# Patient Record
Sex: Female | Born: 1975 | Race: Black or African American | Hispanic: No | Marital: Single | Smoking: Former smoker
Health system: Southern US, Community
[De-identification: ages and names within clinical notes are randomized; demographics above are authoritative.]

## PROBLEM LIST (undated history)

## (undated) DIAGNOSIS — K219 Gastro-esophageal reflux disease without esophagitis: Secondary | ICD-10-CM

## (undated) DIAGNOSIS — IMO0002 Reserved for concepts with insufficient information to code with codable children: Secondary | ICD-10-CM

## (undated) DIAGNOSIS — F419 Anxiety disorder, unspecified: Secondary | ICD-10-CM

## (undated) DIAGNOSIS — R51 Headache: Secondary | ICD-10-CM

## (undated) HISTORY — DX: Anxiety disorder, unspecified: F41.9

---
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
## 1998-08-21 ENCOUNTER — Other Ambulatory Visit: Admission: RE | Admit: 1998-08-21 | Discharge: 1998-08-21 | Payer: Self-pay | Admitting: Obstetrics and Gynecology

## 1999-05-05 ENCOUNTER — Ambulatory Visit (HOSPITAL_COMMUNITY): Admission: RE | Admit: 1999-05-05 | Discharge: 1999-05-05 | Payer: Self-pay | Admitting: *Deleted

## 1999-08-19 ENCOUNTER — Other Ambulatory Visit: Admission: RE | Admit: 1999-08-19 | Discharge: 1999-08-19 | Payer: Self-pay | Admitting: Obstetrics and Gynecology

## 2000-11-18 ENCOUNTER — Other Ambulatory Visit: Admission: RE | Admit: 2000-11-18 | Discharge: 2000-11-18 | Payer: Self-pay | Admitting: *Deleted

## 2003-10-16 ENCOUNTER — Other Ambulatory Visit: Admission: RE | Admit: 2003-10-16 | Discharge: 2003-10-16 | Payer: Self-pay | Admitting: Obstetrics and Gynecology

## 2005-06-26 ENCOUNTER — Encounter: Admission: RE | Admit: 2005-06-26 | Discharge: 2005-06-26 | Payer: Self-pay | Admitting: Obstetrics and Gynecology

## 2012-12-08 LAB — OB RESULTS CONSOLE RUBELLA ANTIBODY, IGM: Rubella: IMMUNE

## 2012-12-08 LAB — OB RESULTS CONSOLE ABO/RH

## 2012-12-08 LAB — OB RESULTS CONSOLE ANTIBODY SCREEN: Antibody Screen: NEGATIVE

## 2013-06-25 ENCOUNTER — Inpatient Hospital Stay (HOSPITAL_COMMUNITY)
Admission: AD | Admit: 2013-06-25 | Discharge: 2013-06-27 | DRG: 371 | Disposition: A | Discharge status: Home / Self Care | Payer: BC Managed Care – PPO | Source: Ambulatory Visit | Attending: Obstetrics and Gynecology | Admitting: Obstetrics and Gynecology

## 2013-06-25 ENCOUNTER — Encounter (HOSPITAL_COMMUNITY)
Admission: AD | Disposition: A | Discharge status: Home / Self Care | Payer: Self-pay | Source: Ambulatory Visit | Attending: Obstetrics and Gynecology

## 2013-06-25 ENCOUNTER — Encounter (HOSPITAL_COMMUNITY): Payer: Self-pay | Admitting: *Deleted

## 2013-06-25 ENCOUNTER — Inpatient Hospital Stay (HOSPITAL_COMMUNITY): Payer: BC Managed Care – PPO | Admitting: Anesthesiology

## 2013-06-25 ENCOUNTER — Encounter (HOSPITAL_COMMUNITY): Payer: Self-pay | Admitting: Anesthesiology

## 2013-06-25 DIAGNOSIS — O09529 Supervision of elderly multigravida, unspecified trimester: Secondary | ICD-10-CM | POA: Diagnosis present

## 2013-06-25 DIAGNOSIS — O324XX0 Maternal care for high head at term, not applicable or unspecified: Secondary | ICD-10-CM | POA: Diagnosis present

## 2013-06-25 HISTORY — DX: Reserved for concepts with insufficient information to code with codable children: IMO0002

## 2013-06-25 LAB — RPR: RPR Ser Ql: NONREACTIVE

## 2013-06-25 LAB — CBC
Platelets: 299 10*3/uL (ref 150–400)
WBC: 13 10*3/uL — ABNORMAL HIGH (ref 4.0–10.5)

## 2013-06-25 LAB — TYPE AND SCREEN

## 2013-06-25 SURGERY — Surgical Case
Anesthesia: Regional | Site: Abdomen | Wound class: Clean Contaminated

## 2013-06-25 MED ORDER — IBUPROFEN 600 MG PO TABS: 600.0000 mg | ORAL_TABLET | Freq: Four times a day (QID) | ORAL | Status: DC | PRN

## 2013-06-25 MED ORDER — FENTANYL CITRATE 0.05 MG/ML IJ SOLN: 25.0000 ug | INTRAMUSCULAR | Status: DC | PRN

## 2013-06-25 MED ORDER — ONDANSETRON HCL 4 MG/2ML IJ SOLN
INTRAMUSCULAR | Status: DC | PRN
  Administered 2013-06-25: 4 mg via INTRAVENOUS

## 2013-06-25 MED ORDER — NALOXONE HCL 1 MG/ML IJ SOLN: 1.0000 ug/kg/h | INTRAVENOUS | Status: DC | PRN

## 2013-06-25 MED ORDER — KETOROLAC TROMETHAMINE 60 MG/2ML IM SOLN: 60.0000 mg | Freq: Once | INTRAMUSCULAR | Status: AC | PRN

## 2013-06-25 MED ORDER — DIPHENHYDRAMINE HCL 50 MG/ML IJ SOLN: 12.5000 mg | INTRAMUSCULAR | Status: DC | PRN

## 2013-06-25 MED ORDER — OXYTOCIN 40 UNITS IN LACTATED RINGERS INFUSION - SIMPLE MED
62.5000 mL/h | INTRAVENOUS | Status: DC
  Filled 2013-06-25: qty 1000

## 2013-06-25 MED ORDER — MEPERIDINE HCL 25 MG/ML IJ SOLN
6.2500 mg | INTRAMUSCULAR | Status: DC | PRN
Start: 2013-06-25 — End: 2013-06-25

## 2013-06-25 MED ORDER — OXYTOCIN 10 UNIT/ML IJ SOLN
INTRAMUSCULAR | Status: AC
  Filled 2013-06-25: qty 4

## 2013-06-25 MED ORDER — SODIUM BICARBONATE 8.4 % IV SOLN
INTRAVENOUS | Status: AC
  Filled 2013-06-25: qty 50

## 2013-06-25 MED ORDER — NALBUPHINE HCL 10 MG/ML IJ SOLN
5.0000 mg | INTRAMUSCULAR | Status: DC | PRN
  Filled 2013-06-25: qty 1

## 2013-06-25 MED ORDER — KETOROLAC TROMETHAMINE 30 MG/ML IJ SOLN: 30.0000 mg | Freq: Four times a day (QID) | INTRAMUSCULAR | Status: DC | PRN

## 2013-06-25 MED ORDER — METOCLOPRAMIDE HCL 5 MG/ML IJ SOLN: 10.0000 mg | Freq: Three times a day (TID) | INTRAMUSCULAR | Status: DC | PRN

## 2013-06-25 MED ORDER — CITRIC ACID-SODIUM CITRATE 334-500 MG/5ML PO SOLN
30.0000 mL | ORAL | Status: DC | PRN
  Administered 2013-06-25: 30 mL via ORAL
  Filled 2013-06-25: qty 15

## 2013-06-25 MED ORDER — OXYTOCIN BOLUS FROM INFUSION: 500.0000 mL | INTRAVENOUS | Status: DC

## 2013-06-25 MED ORDER — MORPHINE SULFATE (PF) 0.5 MG/ML IJ SOLN
INTRAMUSCULAR | Status: DC | PRN
  Administered 2013-06-25: 4 mg via EPIDURAL

## 2013-06-25 MED ORDER — MORPHINE SULFATE 0.5 MG/ML IJ SOLN
INTRAMUSCULAR | Status: AC
  Filled 2013-06-25: qty 10

## 2013-06-25 MED ORDER — EPHEDRINE 5 MG/ML INJ
10.0000 mg | INTRAVENOUS | Status: DC | PRN
  Filled 2013-06-25: qty 4

## 2013-06-25 MED ORDER — DIPHENHYDRAMINE HCL 50 MG/ML IJ SOLN: 25.0000 mg | INTRAMUSCULAR | Status: DC | PRN

## 2013-06-25 MED ORDER — FENTANYL 2.5 MCG/ML BUPIVACAINE 1/10 % EPIDURAL INFUSION (WH - ANES)
14.0000 mL/h | INTRAMUSCULAR | Status: DC | PRN
  Administered 2013-06-25 (×2): 14 mL/h via EPIDURAL
  Filled 2013-06-25 (×2): qty 125

## 2013-06-25 MED ORDER — OXYCODONE-ACETAMINOPHEN 5-325 MG PO TABS: 1.0000 | ORAL_TABLET | ORAL | Status: DC | PRN

## 2013-06-25 MED ORDER — ONDANSETRON HCL 4 MG/2ML IJ SOLN: 4.0000 mg | Freq: Three times a day (TID) | INTRAMUSCULAR | Status: DC | PRN

## 2013-06-25 MED ORDER — NALOXONE HCL 0.4 MG/ML IJ SOLN: 0.4000 mg | INTRAMUSCULAR | Status: DC | PRN

## 2013-06-25 MED ORDER — MEPERIDINE HCL 25 MG/ML IJ SOLN
INTRAMUSCULAR | Status: AC
  Filled 2013-06-25: qty 1

## 2013-06-25 MED ORDER — LACTATED RINGERS IV SOLN: 500.0000 mL | Freq: Once | INTRAVENOUS | Status: DC

## 2013-06-25 MED ORDER — LACTATED RINGERS IV SOLN: 500.0000 mL | INTRAVENOUS | Status: DC | PRN

## 2013-06-25 MED ORDER — OXYTOCIN 40 UNITS IN LACTATED RINGERS INFUSION - SIMPLE MED
1.0000 m[IU]/min | INTRAVENOUS | Status: DC
  Administered 2013-06-25: 2 m[IU]/min via INTRAVENOUS

## 2013-06-25 MED ORDER — ACETAMINOPHEN 325 MG PO TABS: 650.0000 mg | ORAL_TABLET | ORAL | Status: DC | PRN

## 2013-06-25 MED ORDER — NALBUPHINE HCL 10 MG/ML IJ SOLN: 5.0000 mg | INTRAMUSCULAR | Status: DC | PRN

## 2013-06-25 MED ORDER — 0.9 % SODIUM CHLORIDE (POUR BTL) OPTIME
TOPICAL | Status: DC | PRN
  Administered 2013-06-25: 1000 mL

## 2013-06-25 MED ORDER — LACTATED RINGERS IV SOLN
INTRAVENOUS | Status: DC
  Administered 2013-06-25 (×2): via INTRAVENOUS

## 2013-06-25 MED ORDER — DIPHENHYDRAMINE HCL 25 MG PO CAPS: 25.0000 mg | ORAL_CAPSULE | ORAL | Status: DC | PRN

## 2013-06-25 MED ORDER — OXYTOCIN 10 UNIT/ML IJ SOLN
40.0000 [IU] | INTRAVENOUS | Status: DC | PRN
  Administered 2013-06-25: 40 [IU] via INTRAVENOUS

## 2013-06-25 MED ORDER — ONDANSETRON HCL 4 MG/2ML IJ SOLN: 4.0000 mg | Freq: Four times a day (QID) | INTRAMUSCULAR | Status: DC | PRN

## 2013-06-25 MED ORDER — SCOPOLAMINE 1 MG/3DAYS TD PT72
1.0000 | MEDICATED_PATCH | Freq: Once | TRANSDERMAL | Status: DC
  Administered 2013-06-25: 1.5 mg via TRANSDERMAL

## 2013-06-25 MED ORDER — ONDANSETRON HCL 4 MG/2ML IJ SOLN
INTRAMUSCULAR | Status: AC
  Filled 2013-06-25: qty 2

## 2013-06-25 MED ORDER — MEPERIDINE HCL 25 MG/ML IJ SOLN
INTRAMUSCULAR | Status: DC | PRN
  Administered 2013-06-25 (×2): 12.5 mg via INTRAVENOUS

## 2013-06-25 MED ORDER — LACTATED RINGERS IV SOLN
INTRAVENOUS | Status: DC | PRN
  Administered 2013-06-25 (×2): via INTRAVENOUS

## 2013-06-25 MED ORDER — EPHEDRINE 5 MG/ML INJ: 10.0000 mg | INTRAVENOUS | Status: DC | PRN

## 2013-06-25 MED ORDER — PHENYLEPHRINE 40 MCG/ML (10ML) SYRINGE FOR IV PUSH (FOR BLOOD PRESSURE SUPPORT)
80.0000 ug | PREFILLED_SYRINGE | INTRAVENOUS | Status: DC | PRN
  Filled 2013-06-25: qty 5

## 2013-06-25 MED ORDER — CEFAZOLIN SODIUM-DEXTROSE 2-3 GM-% IV SOLR
2.0000 g | Freq: Once | INTRAVENOUS | Status: AC
  Administered 2013-06-25: 2 g via INTRAVENOUS
  Filled 2013-06-25: qty 50

## 2013-06-25 MED ORDER — LIDOCAINE HCL (PF) 1 % IJ SOLN: 30.0000 mL | INTRAMUSCULAR | Status: DC | PRN

## 2013-06-25 MED ORDER — KETOROLAC TROMETHAMINE 60 MG/2ML IM SOLN
INTRAMUSCULAR | Status: AC
  Administered 2013-06-25: 60 mg via INTRAMUSCULAR
  Filled 2013-06-25: qty 2

## 2013-06-25 MED ORDER — LIDOCAINE HCL (PF) 1 % IJ SOLN
INTRAMUSCULAR | Status: DC | PRN
  Administered 2013-06-25 (×2): 5 mL

## 2013-06-25 MED ORDER — SODIUM CHLORIDE 0.9 % IJ SOLN: 3.0000 mL | INTRAMUSCULAR | Status: DC | PRN

## 2013-06-25 MED ORDER — SODIUM BICARBONATE 8.4 % IV SOLN
INTRAVENOUS | Status: DC | PRN
  Administered 2013-06-25: 5 mL via EPIDURAL

## 2013-06-25 MED ORDER — LIDOCAINE-EPINEPHRINE (PF) 2 %-1:200000 IJ SOLN
INTRAMUSCULAR | Status: AC
  Filled 2013-06-25: qty 20

## 2013-06-25 MED ORDER — SCOPOLAMINE 1 MG/3DAYS TD PT72
MEDICATED_PATCH | TRANSDERMAL | Status: AC
  Filled 2013-06-25: qty 1

## 2013-06-25 MED ORDER — TERBUTALINE SULFATE 1 MG/ML IJ SOLN: 0.2500 mg | Freq: Once | INTRAMUSCULAR | Status: DC | PRN

## 2013-06-25 MED ORDER — FLEET ENEMA 7-19 GM/118ML RE ENEM: 1.0000 | ENEMA | RECTAL | Status: DC | PRN

## 2013-06-25 MED ORDER — PHENYLEPHRINE 40 MCG/ML (10ML) SYRINGE FOR IV PUSH (FOR BLOOD PRESSURE SUPPORT): 80.0000 ug | PREFILLED_SYRINGE | INTRAVENOUS | Status: DC | PRN

## 2013-06-25 MED ORDER — ACETAMINOPHEN 10 MG/ML IV SOLN: 1000.0000 mg | Freq: Four times a day (QID) | INTRAVENOUS | Status: DC | PRN

## 2013-06-25 SURGICAL SUPPLY — 29 items
CLAMP CORD UMBIL (MISCELLANEOUS) IMPLANT
CLOTH BEACON ORANGE TIMEOUT ST (SAFETY) ×2 IMPLANT
DRAPE LG THREE QUARTER DISP (DRAPES) ×2 IMPLANT
DRSG OPSITE POSTOP 4X10 (GAUZE/BANDAGES/DRESSINGS) ×2 IMPLANT
DURAPREP 26ML APPLICATOR (WOUND CARE) ×2 IMPLANT
ELECT REM PT RETURN 9FT ADLT (ELECTROSURGICAL) ×2
ELECTRODE REM PT RTRN 9FT ADLT (ELECTROSURGICAL) ×1 IMPLANT
EXTRACTOR VACUUM M CUP 4 TUBE (SUCTIONS) IMPLANT
GLOVE BIO SURGEON STRL SZ 6.5 (GLOVE) ×2 IMPLANT
GLOVE BIOGEL PI IND STRL 6.5 (GLOVE) ×1 IMPLANT
GLOVE BIOGEL PI INDICATOR 6.5 (GLOVE) ×1
GOWN STRL REIN XL XLG (GOWN DISPOSABLE) ×4 IMPLANT
KIT ABG SYR 3ML LUER SLIP (SYRINGE) IMPLANT
NEEDLE HYPO 25X5/8 SAFETYGLIDE (NEEDLE) IMPLANT
NS IRRIG 1000ML POUR BTL (IV SOLUTION) ×2 IMPLANT
PACK C SECTION WH (CUSTOM PROCEDURE TRAY) ×2 IMPLANT
PAD OB MATERNITY 4.3X12.25 (PERSONAL CARE ITEMS) ×2 IMPLANT
RTRCTR C-SECT PINK 25CM LRG (MISCELLANEOUS) ×2 IMPLANT
STAPLER VISISTAT 35W (STAPLE) IMPLANT
SUT MON AB 2-0 CT1 27 (SUTURE) ×2 IMPLANT
SUT MON AB 4-0 PS1 27 (SUTURE) IMPLANT
SUT PDS AB 0 CTX 60 (SUTURE) IMPLANT
SUT PLAIN 2 0 XLH (SUTURE) IMPLANT
SUT VIC AB 0 CTX 36 (SUTURE) ×4
SUT VIC AB 0 CTX36XBRD ANBCTRL (SUTURE) ×4 IMPLANT
SUT VIC AB 4-0 KS 27 (SUTURE) IMPLANT
TOWEL OR 17X24 6PK STRL BLUE (TOWEL DISPOSABLE) ×6 IMPLANT
TRAY FOLEY CATH 14FR (SET/KITS/TRAYS/PACK) IMPLANT
WATER STERILE IRR 1000ML POUR (IV SOLUTION) ×2 IMPLANT

## 2013-06-25 NOTE — Progress Notes (Signed)
Dr. Jean Rosenthal at bedside- MD ordered to give Phenlyphrine and Ephedrine alternating for BP.

## 2013-06-25 NOTE — Anesthesia Preprocedure Evaluation (Signed)
Anesthesia Evaluation  Patient identified by MRN, date of birth, ID band Patient awake    Reviewed: Allergy & Precautions, H&P , Patient's Chart, lab work & pertinent test results  Airway Mallampati: II TM Distance: >3 FB Neck ROM: full    Dental no notable dental hx.    Pulmonary neg pulmonary ROS,  breath sounds clear to auscultation  Pulmonary exam normal       Cardiovascular Exercise Tolerance: Good negative cardio ROS  Rhythm:regular Rate:Normal     Neuro/Psych negative neurological ROS  negative psych ROS   GI/Hepatic negative GI ROS, Neg liver ROS,   Endo/Other  negative endocrine ROS  Renal/GU negative Renal ROS  negative genitourinary   Musculoskeletal   Abdominal Normal abdominal exam  (+)   Peds  Hematology negative hematology ROS (+)   Anesthesia Other Findings   Reproductive/Obstetrics negative OB ROS (+) Pregnancy                           Anesthesia Physical Anesthesia Plan  ASA: II  Anesthesia Plan: Epidural   Post-op Pain Management:    Induction:   Airway Management Planned:   Additional Equipment:   Intra-op Plan:   Post-operative Plan:   Informed Consent: I have reviewed the patients History and Physical, chart, labs and discussed the procedure including the risks, benefits and alternatives for the proposed anesthesia with the patient or authorized representative who has indicated his/her understanding and acceptance.     Plan Discussed with: Anesthesiologist, CRNA and Surgeon  Anesthesia Plan Comments:         Anesthesia Quick Evaluation

## 2013-06-25 NOTE — H&P (Addendum)
37 y.o. [redacted]w[redacted]d  [id number] comes in c/o strong contractions.  Otherwise has good fetal movement and no bleeding.  Past Medical History  Diagnosis Date  . Medical history non-contributory     Past Surgical History  Procedure Laterality Date  . No past surgeries      OB History   Grav Para Term Preterm Abortions TAB SAB Ect Mult Living   5 1 1  3 3    1      # Outc Date GA Lbr Len/2nd Wgt Sex Del Anes PTL Lv   1 TAB 1993           2 TRM 1/96 [redacted]w[redacted]d 04:00 3.175kg(7lb) M SVD  No Yes   3 TAB 6/96           4 TAB 2000           5 CUR               History   Social History  . Marital Status: Married    Spouse Name: N/A    Number of Children: N/A  . Years of Education: N/A   Occupational History  . Not on file.   Social History Main Topics  . Smoking status: Former Games developer  . Smokeless tobacco: Never Used  . Alcohol Use: No  . Drug Use: No  . Sexually Active: Yes    Birth Control/ Protection: None   Other Topics Concern  . Not on file   Social History Narrative  . No narrative on file   Sulfa antibiotics    Prenatal Transfer Tool  Maternal Diabetes: No Genetic Screening: Normal Maternal Ultrasounds/Referrals: Normal Fetal Ultrasounds or other Referrals:  None Maternal Substance Abuse:  No Significant Maternal Medications:  None Significant Maternal Lab Results: Lab values include: Group B Strep negative  Other PNC: uncomplicated    Filed Vitals:   06/25/13 0931  BP: 120/70  Pulse: 89  Temp:   Resp:      Lungs/Cor:  NAD Abdomen:  soft, gravid Ex:  no cords, erythema SVE:  2/90/-2 at admission, now 7/100/-2 FHTs:  145, good STV, NST R, recent late decel x 1 Toco:  q 2-4   A/P   Admit to L&D  GBS Neg  S/p epidural, comfortable  Cont routine mgmt.  Philip Aspen

## 2013-06-25 NOTE — Progress Notes (Signed)
Pt has been pushing 60 mins with no descent of head which is at 0 station.  She has a hx of forceps/vaccuum and episiotomy assisted delivery 74yrs ago for 7lb baby.  I believe this baby is 7/12 to 8lbs and suspect OP presentation.  Throughout labor course pt has had late decelerations which are currently resolved, however baseline has increased to about 165-170.  I discussed cesarean deliver with patient and she agrees, states she is exhausted and doesn't feel like she can push.  Will proceed with cesarean section.  Consent obtained.

## 2013-06-25 NOTE — Anesthesia Procedure Notes (Signed)
Epidural Patient location during procedure: OB Start time: 06/25/2013 4:20 AM  Staffing Anesthesiologist: Angus Seller., Harrell Gave. Performed by: anesthesiologist   Preanesthetic Checklist Completed: patient identified, site marked, surgical consent, pre-op evaluation, timeout performed, IV checked, risks and benefits discussed and monitors and equipment checked  Epidural Patient position: sitting Prep: site prepped and draped and DuraPrep Patient monitoring: continuous pulse ox and blood pressure Approach: midline Injection technique: LOR air and LOR saline  Needle:  Needle type: Tuohy  Needle gauge: 17 G Needle length: 9 cm and 9 Needle insertion depth: 5 cm cm Catheter type: closed end flexible Catheter size: 19 Gauge Catheter at skin depth: 10 cm Test dose: negative  Assessment Events: blood not aspirated, injection not painful, no injection resistance, negative IV test and no paresthesia  Additional Notes Patient identified.  Risk benefits discussed including failed block, incomplete pain control, headache, nerve damage, paralysis, blood pressure changes, nausea, vomiting, reactions to medication both toxic or allergic, and postpartum back pain.  Patient expressed understanding and wished to proceed.  All questions were answered.  Sterile technique used throughout procedure and epidural site dressed with sterile barrier dressing. No paresthesia or other complications noted.The patient did not experience any signs of intravascular injection such as tinnitus or metallic taste in mouth nor signs of intrathecal spread such as rapid motor block. Please see nursing notes for vital signs.

## 2013-06-25 NOTE — Progress Notes (Signed)
Notified of BP- MD orders to give og Phenylephrine

## 2013-06-25 NOTE — Anesthesia Postprocedure Evaluation (Signed)
  Anesthesia Post Note  Patient: Cynthia Garcia  Procedure(s) Performed: Procedure(s) (LRB): Primary cesarean section with delivery of baby girl at 23. Apgars1/7/8 (N/A)  Anesthesia type: Epidural  Patient location: PACU  Post pain: Pain level controlled  Post assessment: Post-op Vital signs reviewed  Last Vitals:  Filed Vitals:   06/25/13 1945  BP: 121/70  Pulse: 82  Temp: 37.2 C  Resp: 15    Post vital signs: Reviewed  Level of consciousness: awake  Complications: No apparent anesthesia complications

## 2013-06-25 NOTE — OR Nursing (Signed)
Uterus massaged by S. Veroncia Jezek RN.   25 cc of blood evacuated from uterus during uterine massage.  Two tubes  of cord blood sent to lab. 

## 2013-06-25 NOTE — Brief Op Note (Signed)
06/25/2013  5:08 PM  PATIENT:  Cynthia Garcia  37 y.o. female  PRE-OPERATIVE DIAGNOSIS:  failure to descend, maternal exhaustion  POST-OPERATIVE DIAGNOSIS:  failure to descend, asynclitic  PROCEDURE:  Procedure(s): Primary cesarean section with delivery of baby girl at 24. Apgars1/7/8 (N/A) LTCS  SURGEON:  Surgeon(s) and Role:    * Philip Aspen, DO - Primary  ANESTHESIA:   epidural  FINDINGS: normal uterus, tubes and ovaries, female infant, OT presentation, Left asynclitism, APGARs 1,7,8 (neonatology suspected due to very thick nasal/oral secretions, Weight 8#7  EBL:  Total I/O In: 1000 [I.V.:1000] Out: 1250 [Urine:400; Blood:850]  BLOOD ADMINISTERED:none  SPECIMEN:  Source of Specimen:  cord pH: 7.15, cord blood   COUNTS:  YES  TOURNIQUET:  * No tourniquets in log *  PATIENT DISPOSITION:  PACU - hemodynamically stable.

## 2013-06-25 NOTE — Transfer of Care (Signed)
Immediate Anesthesia Transfer of Care Note  Patient: Cynthia Garcia  Procedure(s) Performed: Procedure(s): Primary cesarean section with delivery of baby girl at 3. Apgars1/7/8 (N/A)  Patient Location: PACU  Anesthesia Type:Epidural  Level of Consciousness: awake, alert  and oriented  Airway & Oxygen Therapy: Patient Spontanous Breathing  Post-op Assessment: Report given to PACU RN and Post -op Vital signs reviewed and stable  Post vital signs: Reviewed and stable  Complications: No apparent anesthesia complications

## 2013-06-26 ENCOUNTER — Encounter (HOSPITAL_COMMUNITY): Payer: Self-pay | Admitting: Obstetrics and Gynecology

## 2013-06-26 LAB — CBC
MCH: 29 pg (ref 26.0–34.0)
MCHC: 33.4 g/dL (ref 30.0–36.0)

## 2013-06-26 MED ORDER — MENTHOL 3 MG MT LOZG
1.0000 | LOZENGE | OROMUCOSAL | Status: DC | PRN
  Filled 2013-06-26: qty 9

## 2013-06-26 MED ORDER — ONDANSETRON HCL 4 MG/2ML IJ SOLN: 4.0000 mg | INTRAMUSCULAR | Status: DC | PRN

## 2013-06-26 MED ORDER — DIPHENHYDRAMINE HCL 25 MG PO CAPS: 25.0000 mg | ORAL_CAPSULE | Freq: Four times a day (QID) | ORAL | Status: DC | PRN

## 2013-06-26 MED ORDER — PRENATAL MULTIVITAMIN CH
1.0000 | ORAL_TABLET | Freq: Every day | ORAL | Status: DC
Start: 2013-06-26 — End: 2013-06-27
  Administered 2013-06-26 – 2013-06-27 (×2): 1 via ORAL
  Filled 2013-06-26 (×2): qty 1

## 2013-06-26 MED ORDER — LACTATED RINGERS IV SOLN
INTRAVENOUS | Status: DC
  Administered 2013-06-26: 01:00:00 via INTRAVENOUS

## 2013-06-26 MED ORDER — OXYCODONE-ACETAMINOPHEN 5-325 MG PO TABS
1.0000 | ORAL_TABLET | ORAL | Status: DC | PRN
  Administered 2013-06-26 (×4): 1 via ORAL
  Administered 2013-06-27 (×3): 2 via ORAL
  Filled 2013-06-26 (×2): qty 2
  Filled 2013-06-26 (×6): qty 1
  Filled 2013-06-26: qty 2

## 2013-06-26 MED ORDER — SIMETHICONE 80 MG PO CHEW: 80.0000 mg | CHEWABLE_TABLET | ORAL | Status: DC | PRN

## 2013-06-26 MED ORDER — LANOLIN HYDROUS EX OINT: 1.0000 "application " | TOPICAL_OINTMENT | CUTANEOUS | Status: DC | PRN

## 2013-06-26 MED ORDER — IBUPROFEN 600 MG PO TABS
600.0000 mg | ORAL_TABLET | Freq: Four times a day (QID) | ORAL | Status: DC
Start: 2013-06-26 — End: 2013-06-27
  Filled 2013-06-26: qty 1

## 2013-06-26 MED ORDER — ONDANSETRON HCL 4 MG PO TABS: 4.0000 mg | ORAL_TABLET | ORAL | Status: DC | PRN

## 2013-06-26 MED ORDER — OXYTOCIN 40 UNITS IN LACTATED RINGERS INFUSION - SIMPLE MED: 62.5000 mL/h | INTRAVENOUS | Status: AC

## 2013-06-26 MED ORDER — ZOLPIDEM TARTRATE 5 MG PO TABS: 5.0000 mg | ORAL_TABLET | Freq: Every evening | ORAL | Status: DC | PRN

## 2013-06-26 MED ORDER — WITCH HAZEL-GLYCERIN EX PADS: 1.0000 "application " | MEDICATED_PAD | CUTANEOUS | Status: DC | PRN

## 2013-06-26 MED ORDER — TETANUS-DIPHTH-ACELL PERTUSSIS 5-2.5-18.5 LF-MCG/0.5 IM SUSP: 0.5000 mL | Freq: Once | INTRAMUSCULAR | Status: DC

## 2013-06-26 MED ORDER — DIBUCAINE 1 % RE OINT: 1.0000 "application " | TOPICAL_OINTMENT | RECTAL | Status: DC | PRN

## 2013-06-26 MED ORDER — SENNOSIDES-DOCUSATE SODIUM 8.6-50 MG PO TABS
2.0000 | ORAL_TABLET | Freq: Every day | ORAL | Status: DC
Start: 2013-06-26 — End: 2013-06-27
  Administered 2013-06-26: 2 via ORAL

## 2013-06-26 MED ORDER — SIMETHICONE 80 MG PO CHEW
80.0000 mg | CHEWABLE_TABLET | Freq: Three times a day (TID) | ORAL | Status: DC
Start: 2013-06-26 — End: 2013-06-27
  Administered 2013-06-26 – 2013-06-27 (×4): 80 mg via ORAL

## 2013-06-26 NOTE — Progress Notes (Signed)
POD#1 Pt without complaints. Lochia- mod/ VSSAF [id number] Fundus- min tender, appropriate for recent surgery. IMP/ Stable Plan/ routine care

## 2013-06-26 NOTE — Lactation Note (Signed)
This note was copied from the chart of Cynthia Arlis Porta. Lactation Consultation Note Mom reports that baby has been nursing well since delivery. Asking about foods to eat- questions answered, BF brochure given - resources for support after DC discussed with mom- BFSG and OP appointments. Asking about pump from insurance- encouraged to call insurance company and see what they cover and how to obtain it. No further questions at present. To call for assist prn  Patient Name: Cynthia Garcia Today's Date: 06/26/2013 Reason for consult: Initial assessment   Maternal Data Formula Feeding for Exclusion: No Infant to breast within first hour of birth: No Breastfeeding delayed due to:: Maternal status Does the patient have breastfeeding experience prior to this delivery?: No  Feeding Feeding Type: Breast Milk  LATCH Score/Interventions                      Lactation Tools Discussed/Used     Consult Status Consult Status: Follow-up Date: 06/27/13 Follow-up type: In-patient    Pamelia Hoit 06/26/2013, 1:54 PM

## 2013-06-26 NOTE — Op Note (Signed)
NAMEMALAYA, CAGLEY NO.:  [id number]  MEDICAL RECORD NO.:  [id number]  LOCATION:  9105                          FACILITY:  [facility]  PHYSICIAN:  Philip Aspen, DO    DATE OF BIRTH:  [birth year]  DATE OF PROCEDURE: DATE OF DISCHARGE:                              OPERATIVE REPORT   PREOPERATIVE DIAGNOSIS:  Failure to descend, maternal exhaustion.  POSTOPERATIVE DIAGNOSIS:  Failure to descend, occiput transverse to OP and asynclitic.  PROCEDURE:  Low-transverse cesarean section.  SURGEON:  Philip Aspen, DO  ANESTHESIA:  Epidural.  FINDINGS:  Normal uterus, tubes, and ovaries bilaterally.  Female infant in cephalic presentation noted above.  Apgars of 1, 7, and 8.  Weight 8 pounds 7 ounces.  ESTIMATED BLOOD LOSS:  850 mL.  URINE OUTPUT:  400 mL.  FLUIDS:  1000 mL.  Cord pH 7.15.  SPECIMENS:  Cord blood.  COMPLICATIONS:  None.  CONDITION:  Stable to PACU.  DESCRIPTION OF PROCEDURE:  The patient was taken to the operating room, where epidural anesthesia was tested and found to be adequate.  She was then prepped and draped in normal sterile fashion, dorsal supine position.  Pfannenstiel skin incision was made with a scalpel and carried through to the underlying layer of fascia with Bovie cautery. Fascia was incised with a scalpel and extended laterally with Mayo scissors.  Kocher clamps were placed at the superior aspect of the fascial incision, which was tented and rectus muscles dissected off bluntly and sharply.  Kocher clamps were then placed at the inferior aspect of fascial incision, which was also tented with rectus muscles dissected off bluntly and sharply.  Rectus muscles were separated in the midline with hemostat and peritoneum was entered bluntly.  Peritoneal incision was extended bilateral traction.  The abdomen was surveyed manually and found to be normal.  The Alexis self retractor was placed. A bladder flap was created with Metzenbaum  scissors and digital dissection.  A low transverse cesarean incision was made, and the amniotic sac entered sharply.  Meconium was noted.  Uterine incision was extended with cephalic and caudal traction.  The infant's head was elevated and delivered without difficulty followed by the body.  Cord was clamped and cut, and infant was handed off to awaiting neonatology. Sample cord pH was collected and sent to the lab.  The cord blood was collected and the placenta was delivered by gentle traction on the umbilical cord.  The uterus was cleared of all clot and debris.  The uterine incision was closed in 2 layers, 1st with a running locked Vicryl suture and second 5 horizontal Lambert imbrication.  The incision was found to be hemostatic.  An Alexis retractor was removed.  Gutters were cleared of all clot and debris.  The peritoneum was then closed with Monocryl in a running fashion from superior to inferior, followed by inferior to superior reapproximation of rectus muscles without any stricture.  Fascia was then reapproximated and closed with Vicryl in a running fashion.  Subcutaneous tissue was irrigated and dried.  Minimal use of cautery was used for hemostasis.  The skin was reapproximated and closed with staples.  The patient  tolerated the procedure well.  Sponge, lap, and needle counts were correct x2.  The patient was taken to recovery in stable condition.          ______________________________ Philip Aspen, DO     [place]/MEDQ  D:  06/25/2013  T:  06/26/2013  Job:  [id number]

## 2013-06-26 NOTE — Anesthesia Postprocedure Evaluation (Signed)
Anesthesia Post Note  Patient: Cynthia Garcia  Procedure(s) Performed: Procedure(s) (LRB): Primary cesarean section with delivery of baby girl at 10. Apgars1/7/8 (N/A)  Anesthesia type: Epidural  Patient location: Mother/Baby  Post pain: Pain level controlled  Post assessment: Post-op Vital signs reviewed  Last Vitals:  Filed Vitals:   06/26/13 0434  BP: 118/71  Pulse: 78  Temp: 36.5 C  Resp: 18    Post vital signs: Reviewed  Level of consciousness: awake  Complications: No apparent anesthesia complications

## 2013-06-27 MED ORDER — OXYCODONE-ACETAMINOPHEN 5-325 MG PO TABS: 1.0000 | ORAL_TABLET | ORAL | Status: DC | PRN

## 2013-06-27 NOTE — Progress Notes (Signed)
Per new MD order; remove staples prior to discharge, place steri strips; do not replace honeycomb dressing

## 2013-06-27 NOTE — Plan of Care (Signed)
Problem: Discharge Progression Outcomes Goal: Remove staples per MD order Outcome: Not Applicable Date Met:  06/27/13 Pt to return to MD for staple removal Thursday, July 31 at 3:00 PM

## 2013-06-27 NOTE — Progress Notes (Signed)
  Patient is eating, ambulating, voiding.  Pain control is good.  Filed Vitals:   06/26/13 0434 06/26/13 1230 06/26/13 1900 06/27/13 0606  BP: 118/71 97/59 104/66 102/66  Pulse: 78 68 72 88  Temp: 97.7 F (36.5 C) 97 F (36.1 C) 97.5 F (36.4 C) 98.2 F (36.8 C)  TempSrc: Oral Oral Oral Oral  Resp: 18 16 18 17   Height:      Weight:      SpO2: 99%       lungs:   clear to auscultation cor:    RRR Abdomen:  soft, appropriate tenderness, incisions intact and without erythema or exudate ex:    no cords   Lab Results  Component Value Date   WBC 24.5* 06/26/2013   HGB 9.7* 06/26/2013   HCT 29.0* 06/26/2013   MCV 86.8 06/26/2013   PLT 256 06/26/2013    --/--/O POS, O POS (07/27 0315)/RI  A/P    Post operative day 2.  Routine post op and postpartum care.  Expect d/c today.  Percocet for pain control.

## 2013-06-27 NOTE — Discharge Summary (Signed)
Obstetric Discharge Summary Reason for Admission: onset of labor Prenatal Procedures: none Intrapartum Procedures: cesarean: low cervical, transverse Postpartum Procedures: none Complications-Operative and Postpartum: none Hemoglobin  Date Value Range Status  06/26/2013 9.7* 12.0 - 15.0 g/dL Final     DELTA CHECK NOTED     REPEATED TO VERIFY     HCT  Date Value Range Status  06/26/2013 29.0* 36.0 - 46.0 % Final    Discharge Diagnoses: Term Pregnancy-delivered  Discharge Information: Date: 06/27/2013 Activity: pelvic rest Diet: routine Medications: Iron and Percocet Condition: stable Instructions: refer to practice specific booklet Discharge to: home   Newborn Data: Live born female  Birth Weight: 8 lb 7.4 oz (3839 g) APGAR: 1, 7  Home with mother.  Kamil Mchaffie A 06/27/2013, 12:21 PM

## 2013-06-27 NOTE — Progress Notes (Signed)
Patient to return to MD office in 2 days for staple removal per Dr. Henderson Cloud. Instructions given to patient to return to MD office within 2 days. Patient verbalized her understanding.

## 2013-07-27 ENCOUNTER — Other Ambulatory Visit: Payer: Self-pay

## 2014-01-09 LAB — OB RESULTS CONSOLE RUBELLA ANTIBODY, IGM: Rubella: NON-IMMUNE/NOT IMMUNE

## 2014-01-09 LAB — OB RESULTS CONSOLE HIV ANTIBODY (ROUTINE TESTING): HIV: NONREACTIVE

## 2014-06-13 ENCOUNTER — Encounter (HOSPITAL_COMMUNITY): Payer: Self-pay | Admitting: Pharmacy Technician

## 2014-06-18 ENCOUNTER — Encounter (HOSPITAL_COMMUNITY)
Admission: RE | Admit: 2014-06-18 | Discharge: 2014-06-18 | Disposition: A | Discharge status: Home / Self Care | Payer: BC Managed Care – PPO | Source: Ambulatory Visit | Attending: Obstetrics and Gynecology | Admitting: Obstetrics and Gynecology

## 2014-06-18 ENCOUNTER — Encounter (HOSPITAL_COMMUNITY): Payer: Self-pay

## 2014-06-18 DIAGNOSIS — Z01812 Encounter for preprocedural laboratory examination: Secondary | ICD-10-CM | POA: Insufficient documentation

## 2014-06-18 DIAGNOSIS — Z01818 Encounter for other preprocedural examination: Secondary | ICD-10-CM | POA: Insufficient documentation

## 2014-06-18 HISTORY — DX: Headache: R51

## 2014-06-18 HISTORY — DX: Gastro-esophageal reflux disease without esophagitis: K21.9

## 2014-06-18 HISTORY — DX: Reserved for concepts with insufficient information to code with codable children: IMO0002

## 2014-06-18 LAB — TYPE AND SCREEN
ABO/RH(D): O POS
ANTIBODY SCREEN: NEGATIVE

## 2014-06-18 LAB — CBC
HCT: 33.5 % — ABNORMAL LOW (ref 36.0–46.0)
Hemoglobin: 11.1 g/dL — ABNORMAL LOW (ref 12.0–15.0)
MCH: 29 pg (ref 26.0–34.0)
MCHC: 33.1 g/dL (ref 30.0–36.0)
MCV: 87.5 fL (ref 78.0–100.0)
PLATELETS: 257 10*3/uL (ref 150–400)
RBC: 3.83 MIL/uL — ABNORMAL LOW (ref 3.87–5.11)
RDW: 14.1 % (ref 11.5–15.5)
WBC: 7.9 10*3/uL (ref 4.0–10.5)

## 2014-06-18 LAB — RPR

## 2014-06-18 NOTE — Patient Instructions (Addendum)
   Your procedure is scheduled on:  Wednesday, July 22  [facility] at:  6 AM Pick up the phone at the desk and dial [phone] and inform us of your arrival.  Please call this number if you have any problems the morning of surgery: [phone]  Remember: Do not eat or drink after midnight: Tuesday Take these medicines the morning of surgery with a SIP OF WATER:  None  Do not wear jewelry, make-up, or FINGER nail polish No metal in your hair or on your body. Do not wear lotions, powders, perfumes.  You may wear deodorant.  Do not bring valuables to the hospital. Contacts, dentures or bridgework may not be worn into surgery.  Leave suitcase in the car. After Surgery it may be brought to your room. For patients being admitted to the hospital, checkout time is 11:00am the day of discharge.  Home with fiance "Cynthia Garcia"  cell [phone].

## 2014-06-19 ENCOUNTER — Other Ambulatory Visit: Payer: Self-pay | Admitting: Obstetrics and Gynecology

## 2014-06-19 NOTE — H&P (Signed)
38 y.o.  4659w0d    [id number] comes in for a repeat cesarean section at term.  Patient has good fetal movement and no bleeding. She also desires permanent sterilization.  Past Medical History  Diagnosis Date  . Abnormal Pap smear 1996  . SVD (spontaneous vaginal delivery)     x 1  . Termination of pregnancy     x 3  . GERD (gastroesophageal reflux disease)     otc prn with pregnancy  . Headache(784.0)     otc meds prn    Past Surgical History  Procedure Laterality Date  . Cervical biopsy  w/ loop electrode excision  05/1995  . Cesarean section N/A 06/25/2013    Procedure: Primary cesarean section with delivery of baby girl at 1632. Apgars1/7/8;  Surgeon: Philip AspenSidney Callahan, DO;  Location: [facility];  Service: Obstetrics;  Laterality: N/A;    OB History  Gravida Para Term Preterm AB SAB TAB Ectopic Multiple Living  6 2 2  3  3   2     # Outcome Date GA Lbr Len/2nd Weight Sex Delivery Anes PTL Lv  6 CUR           5 TRM 06/25/13 435w0d 14:35 / 01:57 3.839 kg (8 lb 7.4 oz) F LVCS EPI  Y  4 TAB 2000          3 TAB 05/1995          2 TRM 11/1994 1535w0d 04:00 3.175 kg (7 lb) M FCP  N Y     Comments: pt states they used forceps and a vacuum  1 TAB 1993              History   Social History  . Marital Status: Single    Spouse Name: N/A    Number of Children: N/A  . Years of Education: N/A   Occupational History  . Not on file.   Social History Main Topics  . Smoking status: Former Smoker    Types: Cigars    Quit date: 10/06/2012  . Smokeless tobacco: Never Used  . Alcohol Use: No  . Drug Use: No  . Sexual Activity: Yes    Birth Control/ Protection: None     Comment: pregnant   Other Topics Concern  . Not on file   Social History Narrative  . No narrative on file   Sulfa antibiotics   Prenatal Course: uncomplicated   Prenatal Transfer Tool  Maternal Diabetes: No Genetic Screening: Normal Maternal Ultrasounds/Referrals: Normal Fetal Ultrasounds or other Referrals:   None Maternal Substance Abuse:  No Significant Maternal Medications:  None Significant Maternal Lab Results: None   There were no vitals filed for this visit.   Lungs/Cor:  NAD Abdomen:  soft, gravid Ex:  no cords, erythema SVE:  NA FHTs:  present  A/P   For repeat cesarean sectionat term with BTL.  All risks, benefits and alternatives discussed with patient and she desires to proceed.  Ednamae Schiano A

## 2014-06-20 ENCOUNTER — Inpatient Hospital Stay (HOSPITAL_COMMUNITY): Payer: BC Managed Care – PPO | Admitting: Anesthesiology

## 2014-06-20 ENCOUNTER — Inpatient Hospital Stay (HOSPITAL_COMMUNITY)
Admission: RE | Admit: 2014-06-20 | Discharge: 2014-06-22 | DRG: 766 | Disposition: A | Discharge status: Home / Self Care | Payer: Self-pay | Source: Ambulatory Visit | Attending: Obstetrics and Gynecology | Admitting: Obstetrics and Gynecology

## 2014-06-20 ENCOUNTER — Encounter (HOSPITAL_COMMUNITY): Payer: Self-pay | Admitting: Anesthesiology

## 2014-06-20 ENCOUNTER — Encounter (HOSPITAL_COMMUNITY): Payer: Self-pay | Admitting: *Deleted

## 2014-06-20 ENCOUNTER — Encounter (HOSPITAL_COMMUNITY)
Admission: RE | Disposition: A | Discharge status: Home / Self Care | Payer: Self-pay | Source: Ambulatory Visit | Attending: Obstetrics and Gynecology

## 2014-06-20 DIAGNOSIS — Z87891 Personal history of nicotine dependence: Secondary | ICD-10-CM

## 2014-06-20 DIAGNOSIS — Z302 Encounter for sterilization: Secondary | ICD-10-CM

## 2014-06-20 DIAGNOSIS — Z9889 Other specified postprocedural states: Secondary | ICD-10-CM

## 2014-06-20 DIAGNOSIS — O09529 Supervision of elderly multigravida, unspecified trimester: Secondary | ICD-10-CM | POA: Diagnosis present

## 2014-06-20 DIAGNOSIS — O34219 Maternal care for unspecified type scar from previous cesarean delivery: Principal | ICD-10-CM | POA: Diagnosis present

## 2014-06-20 DIAGNOSIS — Z98891 History of uterine scar from previous surgery: Secondary | ICD-10-CM

## 2014-06-20 DIAGNOSIS — K219 Gastro-esophageal reflux disease without esophagitis: Secondary | ICD-10-CM | POA: Diagnosis present

## 2014-06-20 SURGERY — Surgical Case
Anesthesia: Spinal | Site: Abdomen | Laterality: Bilateral

## 2014-06-20 MED ORDER — TETANUS-DIPHTH-ACELL PERTUSSIS 5-2.5-18.5 LF-MCG/0.5 IM SUSP: 0.5000 mL | Freq: Once | INTRAMUSCULAR | Status: DC

## 2014-06-20 MED ORDER — NALOXONE HCL 1 MG/ML IJ SOLN
1.0000 ug/kg/h | INTRAVENOUS | Status: DC | PRN
  Filled 2014-06-20: qty 2

## 2014-06-20 MED ORDER — PRENATAL MULTIVITAMIN CH
1.0000 | ORAL_TABLET | Freq: Every day | ORAL | Status: DC
  Filled 2014-06-20: qty 1

## 2014-06-20 MED ORDER — CEFAZOLIN SODIUM-DEXTROSE 2-3 GM-% IV SOLR
2.0000 g | INTRAVENOUS | Status: AC
  Administered 2014-06-20: 2 g via INTRAVENOUS

## 2014-06-20 MED ORDER — CEFAZOLIN SODIUM-DEXTROSE 2-3 GM-% IV SOLR
INTRAVENOUS | Status: AC
  Filled 2014-06-20: qty 50

## 2014-06-20 MED ORDER — ONDANSETRON HCL 4 MG/2ML IJ SOLN
4.0000 mg | INTRAMUSCULAR | Status: DC | PRN
  Administered 2014-06-20: 4 mg via INTRAVENOUS
  Filled 2014-06-20: qty 2

## 2014-06-20 MED ORDER — PHENYLEPHRINE 8 MG IN D5W 100 ML (0.08MG/ML) PREMIX OPTIME
INJECTION | INTRAVENOUS | Status: DC | PRN
Start: 2014-06-20 — End: 2014-06-20
  Administered 2014-06-20: 60 ug/min via INTRAVENOUS

## 2014-06-20 MED ORDER — NALBUPHINE HCL 10 MG/ML IJ SOLN: 5.0000 mg | INTRAMUSCULAR | Status: DC | PRN

## 2014-06-20 MED ORDER — SODIUM CHLORIDE 0.9 % IJ SOLN: 3.0000 mL | INTRAMUSCULAR | Status: DC | PRN

## 2014-06-20 MED ORDER — PHENYLEPHRINE 8 MG IN D5W 100 ML (0.08MG/ML) PREMIX OPTIME
INJECTION | INTRAVENOUS | Status: AC
  Filled 2014-06-20: qty 100

## 2014-06-20 MED ORDER — SIMETHICONE 80 MG PO CHEW: 80.0000 mg | CHEWABLE_TABLET | ORAL | Status: DC | PRN

## 2014-06-20 MED ORDER — FENTANYL CITRATE 0.05 MG/ML IJ SOLN
INTRAMUSCULAR | Status: AC
  Filled 2014-06-20: qty 2

## 2014-06-20 MED ORDER — OXYTOCIN 40 UNITS IN LACTATED RINGERS INFUSION - SIMPLE MED: 62.5000 mL/h | INTRAVENOUS | Status: AC

## 2014-06-20 MED ORDER — ONDANSETRON HCL 4 MG/2ML IJ SOLN
INTRAMUSCULAR | Status: AC
  Filled 2014-06-20: qty 2

## 2014-06-20 MED ORDER — KETOROLAC TROMETHAMINE 30 MG/ML IJ SOLN: 15.0000 mg | Freq: Once | INTRAMUSCULAR | Status: DC | PRN

## 2014-06-20 MED ORDER — METOCLOPRAMIDE HCL 5 MG/ML IJ SOLN: 10.0000 mg | Freq: Three times a day (TID) | INTRAMUSCULAR | Status: DC | PRN

## 2014-06-20 MED ORDER — DIPHENHYDRAMINE HCL 50 MG/ML IJ SOLN: 25.0000 mg | INTRAMUSCULAR | Status: DC | PRN

## 2014-06-20 MED ORDER — MEPERIDINE HCL 25 MG/ML IJ SOLN: 6.2500 mg | INTRAMUSCULAR | Status: DC | PRN

## 2014-06-20 MED ORDER — MENTHOL 3 MG MT LOZG: 1.0000 | LOZENGE | OROMUCOSAL | Status: DC | PRN

## 2014-06-20 MED ORDER — OXYCODONE-ACETAMINOPHEN 5-325 MG PO TABS
1.0000 | ORAL_TABLET | ORAL | Status: DC | PRN
  Administered 2014-06-21 – 2014-06-22 (×5): 1 via ORAL
  Filled 2014-06-20 (×5): qty 1

## 2014-06-20 MED ORDER — OXYTOCIN 10 UNIT/ML IJ SOLN
40.0000 [IU] | INTRAVENOUS | Status: DC | PRN
  Administered 2014-06-20: 40 [IU] via INTRAVENOUS

## 2014-06-20 MED ORDER — SCOPOLAMINE 1 MG/3DAYS TD PT72
1.0000 | MEDICATED_PATCH | Freq: Once | TRANSDERMAL | Status: DC
  Administered 2014-06-20: 1.5 mg via TRANSDERMAL

## 2014-06-20 MED ORDER — SIMETHICONE 80 MG PO CHEW
80.0000 mg | CHEWABLE_TABLET | ORAL | Status: DC
  Administered 2014-06-21 (×2): 80 mg via ORAL
  Filled 2014-06-20 (×2): qty 1

## 2014-06-20 MED ORDER — LACTATED RINGERS IV SOLN
INTRAVENOUS | Status: DC
  Administered 2014-06-20 (×2): via INTRAVENOUS

## 2014-06-20 MED ORDER — ONDANSETRON HCL 4 MG PO TABS: 4.0000 mg | ORAL_TABLET | ORAL | Status: DC | PRN

## 2014-06-20 MED ORDER — KETOROLAC TROMETHAMINE 30 MG/ML IJ SOLN
30.0000 mg | Freq: Four times a day (QID) | INTRAMUSCULAR | Status: AC | PRN
  Administered 2014-06-20 – 2014-06-21 (×2): 30 mg via INTRAVENOUS
  Filled 2014-06-20 (×2): qty 1

## 2014-06-20 MED ORDER — IBUPROFEN 600 MG PO TABS
600.0000 mg | ORAL_TABLET | Freq: Four times a day (QID) | ORAL | Status: DC
  Administered 2014-06-21: 600 mg via ORAL
  Filled 2014-06-20 (×2): qty 1

## 2014-06-20 MED ORDER — ONDANSETRON HCL 4 MG/2ML IJ SOLN
INTRAMUSCULAR | Status: DC | PRN
  Administered 2014-06-20: 4 mg via INTRAVENOUS

## 2014-06-20 MED ORDER — FENTANYL CITRATE 0.05 MG/ML IJ SOLN
INTRAMUSCULAR | Status: DC | PRN
  Administered 2014-06-20: 25 ug via INTRATHECAL
  Administered 2014-06-20: 75 ug via INTRAVENOUS

## 2014-06-20 MED ORDER — METHYLERGONOVINE MALEATE 0.2 MG/ML IJ SOLN: 0.2000 mg | INTRAMUSCULAR | Status: DC | PRN

## 2014-06-20 MED ORDER — OXYTOCIN 10 UNIT/ML IJ SOLN
INTRAMUSCULAR | Status: AC
  Filled 2014-06-20: qty 4

## 2014-06-20 MED ORDER — DIPHENHYDRAMINE HCL 25 MG PO CAPS
25.0000 mg | ORAL_CAPSULE | ORAL | Status: DC | PRN
  Filled 2014-06-20: qty 1

## 2014-06-20 MED ORDER — FERROUS SULFATE 325 (65 FE) MG PO TABS
325.0000 mg | ORAL_TABLET | Freq: Two times a day (BID) | ORAL | Status: DC
  Administered 2014-06-21 (×2): 325 mg via ORAL
  Filled 2014-06-20 (×2): qty 1

## 2014-06-20 MED ORDER — IBUPROFEN 600 MG PO TABS: 600.0000 mg | ORAL_TABLET | Freq: Four times a day (QID) | ORAL | Status: DC | PRN

## 2014-06-20 MED ORDER — DIPHENHYDRAMINE HCL 50 MG/ML IJ SOLN: 12.5000 mg | INTRAMUSCULAR | Status: DC | PRN

## 2014-06-20 MED ORDER — METHYLERGONOVINE MALEATE 0.2 MG PO TABS: 0.2000 mg | ORAL_TABLET | ORAL | Status: DC | PRN

## 2014-06-20 MED ORDER — MORPHINE SULFATE 0.5 MG/ML IJ SOLN
INTRAMUSCULAR | Status: AC
  Filled 2014-06-20: qty 10

## 2014-06-20 MED ORDER — FLEET ENEMA 7-19 GM/118ML RE ENEM: 1.0000 | ENEMA | Freq: Every day | RECTAL | Status: DC | PRN

## 2014-06-20 MED ORDER — ZOLPIDEM TARTRATE 5 MG PO TABS: 5.0000 mg | ORAL_TABLET | Freq: Every evening | ORAL | Status: DC | PRN

## 2014-06-20 MED ORDER — LANOLIN HYDROUS EX OINT: 1.0000 "application " | TOPICAL_OINTMENT | CUTANEOUS | Status: DC | PRN

## 2014-06-20 MED ORDER — DIBUCAINE 1 % RE OINT: 1.0000 "application " | TOPICAL_OINTMENT | RECTAL | Status: DC | PRN

## 2014-06-20 MED ORDER — MORPHINE SULFATE (PF) 0.5 MG/ML IJ SOLN
INTRAMUSCULAR | Status: DC | PRN
  Administered 2014-06-20: .15 mg via INTRATHECAL
  Administered 2014-06-20: 1.85 mg via INTRAVENOUS

## 2014-06-20 MED ORDER — MEASLES, MUMPS & RUBELLA VAC ~~LOC~~ INJ: 0.5000 mL | INJECTION | Freq: Once | SUBCUTANEOUS | Status: DC

## 2014-06-20 MED ORDER — PROMETHAZINE HCL 25 MG/ML IJ SOLN: 6.2500 mg | INTRAMUSCULAR | Status: DC | PRN

## 2014-06-20 MED ORDER — ONDANSETRON HCL 4 MG/2ML IJ SOLN: 4.0000 mg | Freq: Three times a day (TID) | INTRAMUSCULAR | Status: DC | PRN

## 2014-06-20 MED ORDER — KETOROLAC TROMETHAMINE 30 MG/ML IJ SOLN
30.0000 mg | Freq: Four times a day (QID) | INTRAMUSCULAR | Status: AC | PRN
  Administered 2014-06-20: 30 mg via INTRAMUSCULAR

## 2014-06-20 MED ORDER — KETOROLAC TROMETHAMINE 30 MG/ML IJ SOLN
INTRAMUSCULAR | Status: AC
  Filled 2014-06-20: qty 1

## 2014-06-20 MED ORDER — LACTATED RINGERS IV SOLN
INTRAVENOUS | Status: DC | PRN
  Administered 2014-06-20: 08:00:00 via INTRAVENOUS

## 2014-06-20 MED ORDER — LACTATED RINGERS IV SOLN
Freq: Once | INTRAVENOUS | Status: AC
  Administered 2014-06-20: 06:00:00 via INTRAVENOUS

## 2014-06-20 MED ORDER — SCOPOLAMINE 1 MG/3DAYS TD PT72
MEDICATED_PATCH | TRANSDERMAL | Status: AC
  Administered 2014-06-20: 1.5 mg via TRANSDERMAL
  Filled 2014-06-20: qty 1

## 2014-06-20 MED ORDER — SENNOSIDES-DOCUSATE SODIUM 8.6-50 MG PO TABS
2.0000 | ORAL_TABLET | ORAL | Status: DC
  Administered 2014-06-21: 2 via ORAL
  Filled 2014-06-20 (×2): qty 2

## 2014-06-20 MED ORDER — WITCH HAZEL-GLYCERIN EX PADS: 1.0000 "application " | MEDICATED_PAD | CUTANEOUS | Status: DC | PRN

## 2014-06-20 MED ORDER — DIPHENHYDRAMINE HCL 25 MG PO CAPS: 25.0000 mg | ORAL_CAPSULE | Freq: Four times a day (QID) | ORAL | Status: DC | PRN

## 2014-06-20 MED ORDER — LACTATED RINGERS IV SOLN
INTRAVENOUS | Status: DC
  Administered 2014-06-20 – 2014-06-21 (×2): via INTRAVENOUS

## 2014-06-20 MED ORDER — SIMETHICONE 80 MG PO CHEW
80.0000 mg | CHEWABLE_TABLET | Freq: Three times a day (TID) | ORAL | Status: DC
  Administered 2014-06-20 – 2014-06-21 (×3): 80 mg via ORAL
  Filled 2014-06-20 (×3): qty 1

## 2014-06-20 MED ORDER — BISACODYL 10 MG RE SUPP: 10.0000 mg | Freq: Every day | RECTAL | Status: DC | PRN

## 2014-06-20 MED ORDER — NALOXONE HCL 0.4 MG/ML IJ SOLN: 0.4000 mg | INTRAMUSCULAR | Status: DC | PRN

## 2014-06-20 SURGICAL SUPPLY — 34 items
BENZOIN TINCTURE PRP APPL 2/3 (GAUZE/BANDAGES/DRESSINGS) ×2 IMPLANT
CLAMP CORD UMBIL (MISCELLANEOUS) IMPLANT
CLIP FILSHIE TUBAL LIGA STRL (Clip) ×2 IMPLANT
CLOTH BEACON ORANGE TIMEOUT ST (SAFETY) ×2 IMPLANT
DRAPE LG THREE QUARTER DISP (DRAPES) IMPLANT
DRSG OPSITE POSTOP 4X10 (GAUZE/BANDAGES/DRESSINGS) ×2 IMPLANT
DURAPREP 26ML APPLICATOR (WOUND CARE) ×2 IMPLANT
ELECT REM PT RETURN 9FT ADLT (ELECTROSURGICAL) ×2
ELECTRODE REM PT RTRN 9FT ADLT (ELECTROSURGICAL) ×1 IMPLANT
EXTRACTOR VACUUM BELL STYLE (SUCTIONS) IMPLANT
GLOVE BIO SURGEON STRL SZ7 (GLOVE) ×2 IMPLANT
GOWN STRL NON-REIN LRG LVL3 (GOWN DISPOSABLE) ×2 IMPLANT
GOWN STRL REUS W/TWL LRG LVL3 (GOWN DISPOSABLE) ×4 IMPLANT
KIT ABG SYR 3ML LUER SLIP (SYRINGE) IMPLANT
NEEDLE HYPO 25X5/8 SAFETYGLIDE (NEEDLE) IMPLANT
NS IRRIG 1000ML POUR BTL (IV SOLUTION) ×2 IMPLANT
PACK C SECTION WH (CUSTOM PROCEDURE TRAY) ×2 IMPLANT
PAD OB MATERNITY 4.3X12.25 (PERSONAL CARE ITEMS) ×2 IMPLANT
RETRACTOR WND ALEXIS 25 LRG (MISCELLANEOUS) ×1 IMPLANT
RTRCTR WOUND ALEXIS 25CM LRG (MISCELLANEOUS) ×2
STAPLER VISISTAT 35W (STAPLE) IMPLANT
STRIP CLOSURE SKIN 1/2X4 (GAUZE/BANDAGES/DRESSINGS) ×2 IMPLANT
SUT MNCRL 0 VIOLET CTX 36 (SUTURE) ×3 IMPLANT
SUT MONOCRYL 0 CTX 36 (SUTURE) ×3
SUT PDS AB 0 CTX 60 (SUTURE) IMPLANT
SUT PLAIN 2 0 XLH (SUTURE) ×2 IMPLANT
SUT VIC AB 0 CT1 27 (SUTURE) ×2
SUT VIC AB 0 CT1 27XBRD ANBCTR (SUTURE) ×2 IMPLANT
SUT VIC AB 2-0 CT1 27 (SUTURE) ×1
SUT VIC AB 2-0 CT1 TAPERPNT 27 (SUTURE) ×1 IMPLANT
SUT VIC AB 4-0 KS 27 (SUTURE) ×2 IMPLANT
TOWEL OR 17X24 6PK STRL BLUE (TOWEL DISPOSABLE) ×2 IMPLANT
TRAY FOLEY CATH 14FR (SET/KITS/TRAYS/PACK) ×2 IMPLANT
WATER STERILE IRR 1000ML POUR (IV SOLUTION) IMPLANT

## 2014-06-20 NOTE — Transfer of Care (Signed)
Immediate Anesthesia Transfer of Care Note  Patient: Cynthia JaegerLarin K Roselle  Procedure(s) Performed: Procedure(s): REPEAT CESAREAN SECTION WITH BILATERAL TUBAL LIGATION (Bilateral)  Patient Location: PACU  Anesthesia Type:Spinal  Level of Consciousness: awake, alert  and oriented  Airway & Oxygen Therapy: Patient Spontanous Breathing  Post-op Assessment: Report given to PACU RN and Post -op Vital signs reviewed and stable  Post vital signs: Reviewed and stable  Complications: No apparent anesthesia complications

## 2014-06-20 NOTE — H&P (Signed)
There has been no change in the patients history, status or exam since the history and physical.  Filed Vitals:   06/20/14 0602  BP: 100/46  Pulse: 79  Temp: 97.7 F (36.5 C)  TempSrc: Oral  Resp: 18  SpO2: 99%    Lab Results  Component Value Date   WBC 7.9 06/18/2014   HGB 11.1* 06/18/2014   HCT 33.5* 06/18/2014   MCV 87.5 06/18/2014   PLT 257 06/18/2014    Arran Fessel A

## 2014-06-20 NOTE — Op Note (Signed)
06/20/2014  8:14 AM  PATIENT:  Cynthia Garcia  38 y.o. female  PRE-OPERATIVE DIAGNOSIS:  REPEAT C-section, desires sterilization  POST-OPERATIVE DIAGNOSIS:  REPEAT C-section, same  PROCEDURE:  Procedure(s): REPEAT CESAREAN SECTION WITH BILATERAL TUBAL LIGATION (Bilateral)  SURGEON:  Surgeon(s) and Role:    * Loney LaurenceMichelle A Cheril Slattery, MD - Primary    * W Lodema HongScott Bowie, MD - Assisting  ANESTHESIA:   spinal  EBL:  Total I/O In: 2300 [I.V.:2300] Out: 850 [Urine:200; Blood:650]   SPECIMEN:  No Specimen  DISPOSITION OF SPECIMEN:  N/A  COUNTS:  YES  TOURNIQUET:  * No tourniquets in log *  DICTATION: .Note written in EPIC  PLAN OF CARE: Admit to inpatient   PATIENT DISPOSITION:  PACU - hemodynamically stable.   Delay start of Pharmacological VTE agent (>24hrs) due to surgical blood loss or risk of bleeding: not applicable  Complications: none Medications:  Ancef, Pitocin Findings:  Baby female, Apgars 9,9, weight P.   Normal tubes, ovaries and uterus seen.  Baby was skin to skin with mother after birth in the OR.  Technique:  After adequate spinal anesthesia was achieved, the patient was prepped and draped in usual sterile fashion.  A foley catheter was used to drain the bladder.  A pfannanstiel incision was made with the scalpel and carried down to the fascia with the bovie cautery. The fascia was incised in the midline with the scalpel and carried in a transverse curvilinear manner bilaterally.  The fascia was reflected superiorly and inferiorly off the rectus muscles and the muscles split in the midline.  A bowel free portion of the peritoneum was entered bluntly and then extended in a superior and inferior manner with good visualization of the bowel and bladder.  The Alexis instrument was then placed and the vesico-uterine fascia tented up and incised in a transverse curvilinear manner.  A 2 cm transverse incision was made in the upper portion of the lower uterine segment until the  amnion was exposed.   The incision was extended transversely in a blunt manner.  Clear fluid was noted and the baby delivered in the vertex presentation without complication.  The baby was bulb suctioned and the cord was clamped and cut.  The baby was then handed to awaiting Neonatology.  The placenta was then delivered manually and the uterus cleared of all debris.  The uterine incision was then closed with a running lock stitch of 0 monocryl.  An imbricating layer of 0 monocryl was closed as well. A small hole in the lower uterine edge developed as the suture was pulled through- this was closed with an additional running lock stitch and reinforced with a figure of eight.  Excellent hemostasis of the uterine incision was achieved and the abdomen was cleared with irrigation.    At this point the tubal ligation was begun.  Prior to the surgery the patient was counseled that the procedure was permanent and all risks, benefits, and alternatives had been discussed.  The each tube in succession was tented up with a babcock, followed to their fimbriated end and the filschie clips were placed on the isthmic portion of each tube.  The clips were confirmed to occlude the entire circumference of each tube. The peritoneum was then closed with a running stitch of 2-0 vicryl.  This incorporated the rectus muscles as a separate layer.  The fascia was then closed with a running stitch of 0 vicryl.  The subcutaneous layer was closed with interrupted  stitches  of 2-0 plain gut.  The skin was closed with 4-0 vicryl on a Keith needle and steri-strips.  The patient tolerated the procedure well and was returned to the recovery room in stable condition.  All counts were correct times three.  Ever Halberg A

## 2014-06-20 NOTE — Brief Op Note (Signed)
06/20/2014  8:14 AM  PATIENT:  Cynthia Garcia  38 y.o. female  PRE-OPERATIVE DIAGNOSIS:  REPEAT C-section, desires sterilization  POST-OPERATIVE DIAGNOSIS:  REPEAT C-section, same  PROCEDURE:  Procedure(s): REPEAT CESAREAN SECTION WITH BILATERAL TUBAL LIGATION (Bilateral)  SURGEON:  Surgeon(s) and Role:    * Loney Laurence, MD - Primary    * W Lodema Hong, MD - Assisting  ANESTHESIA:   spinal  EBL:  Total I/O In: 2300 [I.V.:2300] Out: 850 [Urine:200; Blood:650]   SPECIMEN:  No Specimen  DISPOSITION OF SPECIMEN:  N/A  COUNTS:  YES  TOURNIQUET:  * No tourniquets in log *  DICTATION: .Note written in EPIC  PLAN OF CARE: Admit to inpatient   PATIENT DISPOSITION:  PACU - hemodynamically stable.   Delay start of Pharmacological VTE agent (>24hrs) due to surgical blood loss or risk of bleeding: not applicable  Complications: none Medications:  Ancef, Pitocin Findings:  Baby female, Apgars 9,9, weight P.   Normal tubes, ovaries and uterus seen.  Baby was skin to skin with mother after birth in the OR.  Technique:  After adequate spinal anesthesia was achieved, the patient was prepped and draped in usual sterile fashion.  A foley catheter was used to drain the bladder.  A pfannanstiel incision was made with the scalpel and carried down to the fascia with the bovie cautery. The fascia was incised in the midline with the scalpel and carried in a transverse curvilinear manner bilaterally.  The fascia was reflected superiorly and inferiorly off the rectus muscles and the muscles split in the midline.  A bowel free portion of the peritoneum was entered bluntly and then extended in a superior and inferior manner with good visualization of the bowel and bladder.  The Alexis instrument was then placed and the vesico-uterine fascia tented up and incised in a transverse curvilinear manner.  A 2 cm transverse incision was made in the upper portion of the lower uterine segment until the  amnion was exposed.   The incision was extended transversely in a blunt manner.  Clear fluid was noted and the baby delivered in the vertex presentation without complication.  The baby was bulb suctioned and the cord was clamped and cut.  The baby was then handed to awaiting Neonatology.  The placenta was then delivered manually and the uterus cleared of all debris.  The uterine incision was then closed with a running lock stitch of 0 monocryl.  An imbricating layer of 0 monocryl was closed as well. Excellent hemostasis of the uterine incision was achieved and the abdomen was cleared with irrigation.    At this point the tubal ligation was begun.  Prior to the surgery the patient was counseled that the procedure was permanent and all risks, benefits, and alternatives had been discussed.  The each tube in succession was tented up with a babcock and the filschie clips were placed on the isthmic portion of each tube.  The clips were confirmed to occlude the entire circumference of each tube. The peritoneum was then closed with a running stitch of 2-0 vicryl.  This incorporated the rectus muscles as a separate layer.  The fascia was then closed with a running stitch of 0 vicryl.  The subcutaneous layer was closed with interrupted  stitches of 2-0 plain gut.  The skin was closed with 4-0 vicryl on a Keith needle and steri-strips.  The patient tolerated the procedure well and was returned to the recovery room in stable condition.  All  counts were correct times three.  Cynthia Garcia A

## 2014-06-20 NOTE — Anesthesia Postprocedure Evaluation (Signed)
  Anesthesia Post-op Note  Patient: Cynthia Garcia  Procedure(s) Performed: Procedure(s): REPEAT CESAREAN SECTION WITH BILATERAL TUBAL LIGATION (Bilateral)  Patient is awake, responsive, moving her legs, and has signs of resolution of her numbness. Pain and nausea are reasonably well controlled. Vital signs are stable and clinically acceptable. Oxygen saturation is clinically acceptable. There are no apparent anesthetic complications at this time. Patient is ready for discharge.

## 2014-06-20 NOTE — Anesthesia Postprocedure Evaluation (Signed)
Anesthesia Post Note  Patient: Cynthia Garcia  Procedure(s) Performed: Procedure(s) (LRB): REPEAT CESAREAN SECTION WITH BILATERAL TUBAL LIGATION (Bilateral)  Anesthesia type: Spinal  Patient location: Mother/Baby  Post pain: Pain level controlled  Post assessment: Post-op Vital signs reviewed  Last Vitals:  Filed Vitals:   06/20/14 1400  BP: 119/62  Pulse: 59  Temp: 36.4 C  Resp: 18    Post vital signs: Reviewed  Level of consciousness: awake  Complications: No apparent anesthesia complications

## 2014-06-20 NOTE — Anesthesia Procedure Notes (Addendum)

## 2014-06-20 NOTE — Consult Note (Signed)
Neonatology Note:  Attendance at C-section:  I was asked by Dr. Horvath to attend this repeat C/S at term. The mother is a G6P2A3 O pos, GBS neg with an uncomplicated pregnancy. ROM at delivery, fluid clear. Infant vigorous with good spontaneous cry and tone. Needed only bulb suctioning. Ap 9/9. Lungs clear to ausc in DR. To CN to care of Pediatrician.  Sammie Denner C. Emad Brechtel, MD  

## 2014-06-20 NOTE — Anesthesia Preprocedure Evaluation (Signed)
Anesthesia Evaluation  Patient identified by MRN, date of birth, ID band Patient awake    Reviewed: Allergy & Precautions, H&P , NPO status , Patient's Chart, lab work & pertinent test results  Airway Mallampati: I TM Distance: >3 FB Neck ROM: full    Dental no notable dental hx.    Pulmonary neg pulmonary ROS, former smoker,    Pulmonary exam normal       Cardiovascular negative cardio ROS      Neuro/Psych negative psych ROS   GI/Hepatic Neg liver ROS,   Endo/Other  negative endocrine ROS  Renal/GU negative Renal ROS     Musculoskeletal   Abdominal Normal abdominal exam  (+)   Peds  Hematology negative hematology ROS (+)   Anesthesia Other Findings   Reproductive/Obstetrics (+) Pregnancy                           Anesthesia Physical Anesthesia Plan  ASA: II  Anesthesia Plan: Spinal   Post-op Pain Management:    Induction:   Airway Management Planned:   Additional Equipment:   Intra-op Plan:   Post-operative Plan:   Informed Consent: I have reviewed the patients History and Physical, chart, labs and discussed the procedure including the risks, benefits and alternatives for the proposed anesthesia with the patient or authorized representative who has indicated his/her understanding and acceptance.     Plan Discussed with: CRNA, Anesthesiologist and Surgeon  Anesthesia Plan Comments:         Anesthesia Quick Evaluation

## 2014-06-21 ENCOUNTER — Encounter (HOSPITAL_COMMUNITY): Payer: Self-pay | Admitting: Obstetrics and Gynecology

## 2014-06-21 LAB — CBC
HCT: 29.1 % — ABNORMAL LOW (ref 36.0–46.0)
Hemoglobin: 9.4 g/dL — ABNORMAL LOW (ref 12.0–15.0)
MCH: 28.1 pg (ref 26.0–34.0)
MCHC: 32.3 g/dL (ref 30.0–36.0)
MCV: 86.9 fL (ref 78.0–100.0)
Platelets: 243 10*3/uL (ref 150–400)
RBC: 3.35 MIL/uL — ABNORMAL LOW (ref 3.87–5.11)
RDW: 14.2 % (ref 11.5–15.5)
WBC: 12.4 10*3/uL — AB (ref 4.0–10.5)

## 2014-06-21 LAB — BIRTH TISSUE RECOVERY COLLECTION (PLACENTA DONATION)

## 2014-06-21 MED ORDER — COMPLETENATE 29-1 MG PO CHEW
1.0000 | CHEWABLE_TABLET | Freq: Every day | ORAL | Status: DC
  Administered 2014-06-21: 1 via ORAL
  Filled 2014-06-21 (×2): qty 1

## 2014-06-21 MED ORDER — MEASLES, MUMPS & RUBELLA VAC ~~LOC~~ INJ
0.5000 mL | INJECTION | Freq: Once | SUBCUTANEOUS | Status: DC
  Filled 2014-06-21: qty 0.5

## 2014-06-21 NOTE — Progress Notes (Addendum)
  Patient is eating, ambulating, voiding.  Pain control is good.  Filed Vitals:   06/20/14 1800 06/20/14 2200 06/21/14 0200 06/21/14 0555  BP: 119/75 114/57  104/54  Pulse: 76 68 60 56  Temp: 98.2 F (36.8 C) 98.3 F (36.8 C) 98.4 F (36.9 C) 98.6 F (37 C)  TempSrc: Oral Oral Oral Oral  Resp: 18 20 20 18   Weight:      SpO2: 98% 100% 96% 96%    lungs:   clear to auscultation cor:    RRR Abdomen:  soft, appropriate tenderness, incisions intact and without erythema or exudate ex:    no cords   Lab Results  Component Value Date   WBC 12.4* 06/21/2014   HGB 9.4* 06/21/2014   HCT 29.1* 06/21/2014   MCV 86.9 06/21/2014   PLT 243 06/21/2014    --/--/O POS (07/20 1519)/RI  A/P    Post operative day 1.  Routine post op and postpartum care.  Expect d/c tomorrow.  Percocet for pain control.

## 2014-06-21 NOTE — Progress Notes (Signed)
Pt self pay - will do circ in office.

## 2014-06-21 NOTE — Lactation Note (Signed)
This note was copied from the chart of Cynthia Garcia. Lactation Consultation Note     Initia consult with this mom and baby. Mom has decided to formula feed.  Patient Name: Cynthia Arlis PortaLarin Romagnoli [id number]'s Date: 06/21/2014 Reason for consult: Initial assessment   Maternal Data Formula Feeding for Exclusion: Yes (baby went to NICU for 6 hours) Reason for exclusion: Mother's choice to formula and breast feed on admission Infant to breast within first hour of birth: Yes Does the patient have breastfeeding experience prior to this delivery?: Yes  Feeding Feeding Type: Formula  LATCH Score/Interventions                      Lactation Tools Discussed/Used     Consult Status Consult Status: Complete    Alfred LevinsLee, [place] Dann Anne 06/21/2014, 12:17 PM

## 2014-06-22 MED ORDER — OXYCODONE-ACETAMINOPHEN 5-325 MG PO TABS: 1.0000 | ORAL_TABLET | ORAL | Status: DC | PRN

## 2014-06-22 NOTE — Discharge Summary (Signed)
Obstetric Discharge Summary Reason for Admission: cesarean section Prenatal Procedures: ultrasound Intrapartum Procedures: cesarean: low cervical, transverse Postpartum Procedures: none Complications-Operative and Postpartum: none Hemoglobin  Date Value Ref Range Status  06/21/2014 9.4* 12.0 - 15.0 g/dL Final     HCT  Date Value Ref Range Status  06/21/2014 29.1* 36.0 - 46.0 % Final    Physical Exam:  General: alert and cooperative Lochia: appropriate Uterine Fundus: firm Incision: healing well, no significant drainage, no significant erythema DVT Evaluation: No evidence of DVT seen on physical exam.  Discharge Diagnoses: Term Pregnancy-delivered  Discharge Information: Date: 06/22/2014 Activity: pelvic rest Diet: routine Medications: PNV, Ibuprofen, Colace and Percocet Condition: stable Instructions: refer to practice specific booklet Discharge to: home Follow-up Information   Follow up with HORVATH,MICHELLE A, MD In 4 weeks.   Specialty:  Obstetrics and Gynecology   Contact information:   [address] [place] [address] [phone]       Newborn Data: Live born female  Birth Weight: 7 lb 13.2 oz (3550 g) APGAR: 9, 9  Home with mother.  Cynthia Garcia, Cynthia Garcia 06/22/2014, 9:54 AM

## 2014-10-01 ENCOUNTER — Encounter (HOSPITAL_COMMUNITY): Payer: Self-pay | Admitting: Obstetrics and Gynecology

## 2015-09-23 ENCOUNTER — Encounter (HOSPITAL_COMMUNITY): Payer: Self-pay | Admitting: *Deleted

## 2015-09-23 ENCOUNTER — Emergency Department (INDEPENDENT_AMBULATORY_CARE_PROVIDER_SITE_OTHER)
Admission: EM | Admit: 2015-09-23 | Discharge: 2015-09-23 | Disposition: A | Discharge status: Other Acute Inpatient Hospital | Payer: Self-pay | Source: Home / Self Care | Attending: Family Medicine | Admitting: Family Medicine

## 2015-09-23 ENCOUNTER — Emergency Department (HOSPITAL_COMMUNITY)
Admission: EM | Admit: 2015-09-23 | Discharge: 2015-09-24 | Disposition: A | Discharge status: Home / Self Care | Payer: Self-pay | Attending: Emergency Medicine | Admitting: Emergency Medicine

## 2015-09-23 DIAGNOSIS — R5383 Other fatigue: Secondary | ICD-10-CM | POA: Insufficient documentation

## 2015-09-23 DIAGNOSIS — K219 Gastro-esophageal reflux disease without esophagitis: Secondary | ICD-10-CM | POA: Insufficient documentation

## 2015-09-23 DIAGNOSIS — N12 Tubulo-interstitial nephritis, not specified as acute or chronic: Secondary | ICD-10-CM

## 2015-09-23 DIAGNOSIS — R000 Tachycardia, unspecified: Secondary | ICD-10-CM | POA: Insufficient documentation

## 2015-09-23 DIAGNOSIS — Z87891 Personal history of nicotine dependence: Secondary | ICD-10-CM | POA: Insufficient documentation

## 2015-09-23 LAB — POCT URINALYSIS DIP (DEVICE)
Bilirubin Urine: NEGATIVE
GLUCOSE, UA: 100 mg/dL — AB
Ketones, ur: NEGATIVE mg/dL
NITRITE: POSITIVE — AB
Protein, ur: 100 mg/dL — AB
SPECIFIC GRAVITY, URINE: 1.025 (ref 1.005–1.030)
Urobilinogen, UA: 0.2 mg/dL (ref 0.0–1.0)
pH: 5.5 (ref 5.0–8.0)

## 2015-09-23 LAB — CBC WITH DIFFERENTIAL/PLATELET
BASOS ABS: 0 10*3/uL (ref 0.0–0.1)
BASOS PCT: 0 %
EOS PCT: 0 %
Eosinophils Absolute: 0 10*3/uL (ref 0.0–0.7)
HCT: 34.2 % — ABNORMAL LOW (ref 36.0–46.0)
Hemoglobin: 11.1 g/dL — ABNORMAL LOW (ref 12.0–15.0)
Lymphocytes Relative: 7 %
Lymphs Abs: 1.2 10*3/uL (ref 0.7–4.0)
MCH: 28.2 pg (ref 26.0–34.0)
MCHC: 32.5 g/dL (ref 30.0–36.0)
MCV: 87 fL (ref 78.0–100.0)
MONO ABS: 1.6 10*3/uL — AB (ref 0.1–1.0)
Monocytes Relative: 10 %
Neutro Abs: 12.7 10*3/uL — ABNORMAL HIGH (ref 1.7–7.7)
Neutrophils Relative %: 83 %
PLATELETS: 279 10*3/uL (ref 150–400)
RBC: 3.93 MIL/uL (ref 3.87–5.11)
RDW: 12.9 % (ref 11.5–15.5)
WBC: 15.5 10*3/uL — ABNORMAL HIGH (ref 4.0–10.5)

## 2015-09-23 LAB — COMPREHENSIVE METABOLIC PANEL
ALBUMIN: 3.5 g/dL (ref 3.5–5.0)
ALK PHOS: 79 U/L (ref 38–126)
ALT: 26 U/L (ref 14–54)
AST: 37 U/L (ref 15–41)
Anion gap: 9 (ref 5–15)
BUN: 9 mg/dL (ref 6–20)
CALCIUM: 8.8 mg/dL — AB (ref 8.9–10.3)
CO2: 22 mmol/L (ref 22–32)
CREATININE: 0.77 mg/dL (ref 0.44–1.00)
Chloride: 101 mmol/L (ref 101–111)
GFR calc Af Amer: >60 mL/min (ref >60)
GFR calc non Af Amer: >60 mL/min (ref >60)
GLUCOSE: 136 mg/dL — AB (ref 65–99)
Potassium: 3.6 mmol/L (ref 3.5–5.1)
Sodium: 132 mmol/L — ABNORMAL LOW (ref 135–145)
TOTAL PROTEIN: 6.8 g/dL (ref 6.5–8.1)
Total Bilirubin: 1.3 mg/dL — ABNORMAL HIGH (ref 0.3–1.2)

## 2015-09-23 LAB — POCT PREGNANCY, URINE: Preg Test, Ur: NEGATIVE

## 2015-09-23 MED ORDER — ONDANSETRON HCL 4 MG/2ML IJ SOLN
4.0000 mg | Freq: Once | INTRAMUSCULAR | Status: AC
  Administered 2015-09-23: 4 mg via INTRAVENOUS
  Filled 2015-09-23: qty 2

## 2015-09-23 MED ORDER — SODIUM CHLORIDE 0.9 % IV SOLN
1000.0000 mL | Freq: Once | INTRAVENOUS | Status: AC
  Administered 2015-09-23: 1000 mL via INTRAVENOUS

## 2015-09-23 MED ORDER — IBUPROFEN 400 MG PO TABS: 400.0000 mg | ORAL_TABLET | Freq: Four times a day (QID) | ORAL | Status: AC | PRN

## 2015-09-23 MED ORDER — CEPHALEXIN 500 MG PO CAPS: 500.0000 mg | ORAL_CAPSULE | Freq: Four times a day (QID) | ORAL | Status: DC

## 2015-09-23 MED ORDER — HYDROCODONE-ACETAMINOPHEN 5-325 MG PO TABS: 1.0000 | ORAL_TABLET | ORAL | Status: DC | PRN

## 2015-09-23 MED ORDER — CEFTRIAXONE SODIUM 1 G IJ SOLR
1.0000 g | Freq: Once | INTRAMUSCULAR | Status: AC
  Administered 2015-09-23: 1 g via INTRAVENOUS
  Filled 2015-09-23: qty 10

## 2015-09-23 MED ORDER — ACETAMINOPHEN 325 MG PO TABS
ORAL_TABLET | ORAL | Status: AC
  Filled 2015-09-23: qty 2

## 2015-09-23 MED ORDER — ACETAMINOPHEN 325 MG PO TABS
650.0000 mg | ORAL_TABLET | Freq: Once | ORAL | Status: AC
  Administered 2015-09-23: 650 mg via ORAL

## 2015-09-23 MED ORDER — ONDANSETRON HCL 4 MG PO TABS: 4.0000 mg | ORAL_TABLET | Freq: Four times a day (QID) | ORAL | Status: DC

## 2015-09-23 MED ORDER — KETOROLAC TROMETHAMINE 30 MG/ML IJ SOLN
30.0000 mg | Freq: Once | INTRAMUSCULAR | Status: AC
  Administered 2015-09-23: 30 mg via INTRAVENOUS
  Filled 2015-09-23: qty 1

## 2015-09-23 MED ORDER — MORPHINE SULFATE (PF) 4 MG/ML IV SOLN
4.0000 mg | Freq: Once | INTRAVENOUS | Status: AC
  Administered 2015-09-23: 4 mg via INTRAVENOUS
  Filled 2015-09-23: qty 1

## 2015-09-23 NOTE — ED Notes (Signed)
Pt  Reports     Back  Pain      -   Chills       Frequent      Urination         X  4  Days               Fever

## 2015-09-23 NOTE — ED Provider Notes (Signed)
CSN: [id number]     Arrival date & time 09/23/15  2011 History   First MD Initiated Contact with Patient 09/23/15 2043     Chief Complaint  Patient presents with  . Pyelonephritis     (Consider location/radiation/quality/duration/timing/severity/associated sxs/prior Treatment) Patient is a 39 y.o. female presenting with dysuria. The history is provided by the patient and medical records.  Dysuria Pain quality:  Aching and burning Pain severity:  Severe Onset quality:  Gradual Duration:  4 days Timing:  Constant Progression:  Worsening Chronicity:  New Recent urinary tract infections: no   Relieved by:  None tried Worsened by:  Nothing tried Ineffective treatments:  NSAIDs Associated symptoms: fever, flank pain and nausea   Associated symptoms: no abdominal pain and no vomiting   Risk factors: no hx of pyelonephritis, not pregnant and no recurrent urinary tract infections     Past Medical History  Diagnosis Date  . Abnormal Pap smear 1996  . SVD (spontaneous vaginal delivery)     x 1  . Termination of pregnancy     x 3  . GERD (gastroesophageal reflux disease)     otc prn with pregnancy  . Headache(784.0)     otc meds prn   Past Surgical History  Procedure Laterality Date  . Cervical biopsy  w/ loop electrode excision  05/1995  . Cesarean section N/A 06/25/2013    Procedure: Primary cesarean section with delivery of baby girl at 1632. Apgars1/7/8;  Surgeon: Philip AspenSidney Callahan, DO;  Location: [facility];  Service: Obstetrics;  Laterality: N/A;  . Cesarean section with bilateral tubal ligation Bilateral 06/20/2014    Procedure: REPEAT CESAREAN SECTION WITH BILATERAL TUBAL LIGATION;  Surgeon: Loney LaurenceMichelle A Horvath, MD;  Location: [facility];  Service: Obstetrics;  Laterality: Bilateral;   History reviewed. No pertinent family history. Social History  Substance Use Topics  . Smoking status: Former Smoker    Types: Cigars    Quit date: 10/06/2012  . Smokeless tobacco: Never Used  .  Alcohol Use: No   OB History    Gravida Para Term Preterm AB TAB SAB Ectopic Multiple Living   6 3 3  3 3    3      Review of Systems  Constitutional: Positive for fever, chills and fatigue.  HENT: Negative for facial swelling.   Respiratory: Negative for shortness of breath.   Cardiovascular: Negative for chest pain.  Gastrointestinal: Positive for nausea. Negative for vomiting and abdominal pain.  Genitourinary: Positive for dysuria and flank pain. Negative for hematuria.  Musculoskeletal: Positive for back pain.  Skin: Negative for rash.  Neurological: Negative for headaches.  Psychiatric/Behavioral: Negative for confusion.      Allergies  Sulfa antibiotics  Home Medications   Prior to Admission medications   Medication Sig Start Date End Date Taking? Authorizing Provider  acetaminophen (TYLENOL) 325 MG tablet Take 650 mg by mouth every 6 (six) hours as needed for mild pain or headache.    Yes Historical Provider, MD  calcium carbonate (TUMS - DOSED IN MG ELEMENTAL CALCIUM) 500 MG chewable tablet Chew 2 tablets by mouth daily as needed for indigestion or heartburn.   Yes Historical Provider, MD  cephALEXin (KEFLEX) 500 MG capsule Take 1 capsule (500 mg total) by mouth 4 (four) times daily. 09/23/15   Gavin PoundJustin Koleen Celia, MD  HYDROcodone-acetaminophen (NORCO/VICODIN) 5-325 MG tablet Take 1 tablet by mouth every 4 (four) hours as needed. 09/23/15   Gavin PoundJustin Ressie Slevin, MD  ibuprofen (ADVIL,MOTRIN) 400 MG tablet Take 1 tablet (  400 mg total) by mouth every 6 (six) hours as needed. 09/23/15   Gavin Pound, MD  ondansetron (ZOFRAN) 4 MG tablet Take 1 tablet (4 mg total) by mouth every 6 (six) hours. 09/23/15   Gavin Pound, MD   BP 109/65 mmHg  Pulse 87  Temp(Src) 99.6 F (37.6 C) (Oral)  Resp 16  Ht  (1.702 m)  Wt 135 lb (61.236 kg)  BMI 21.14 kg/m2  SpO2 97%  LMP 09/18/2015 (Exact Date)  Breastfeeding? No Physical Exam  Constitutional: She is oriented to person, place,  and time. She appears well-developed and well-nourished. No distress.  HENT:  Head: Normocephalic and atraumatic.  Right Ear: External ear normal.  Left Ear: External ear normal.  Nose: Nose normal.  Mouth/Throat: Oropharynx is clear and moist. No oropharyngeal exudate.  Eyes: Conjunctivae and EOM are normal. Pupils are equal, round, and reactive to light. Right eye exhibits no discharge. Left eye exhibits no discharge. No scleral icterus.  Neck: Normal range of motion. Neck supple. No JVD present. No tracheal deviation present. No thyromegaly present.  Cardiovascular: Regular rhythm and intact distal pulses.  Tachycardia present.   Pulmonary/Chest: Effort normal and breath sounds normal. No stridor. No respiratory distress. She has no wheezes. She has no rales. She exhibits no tenderness.  Abdominal: Soft. She exhibits no distension. There is tenderness in the suprapubic area. There is CVA tenderness.  Musculoskeletal: Normal range of motion. She exhibits no edema or tenderness.  Lymphadenopathy:    She has no cervical adenopathy.  Neurological: She is alert and oriented to person, place, and time.  Skin: Skin is warm and dry. No rash noted. She is not diaphoretic. No erythema. No pallor.  Psychiatric: She has a normal mood and affect. Her behavior is normal. Judgment and thought content normal.  Nursing note and vitals reviewed.   ED Course  Procedures (including critical care time) Labs Review Labs Reviewed  COMPREHENSIVE METABOLIC PANEL - Abnormal; Notable for the following:    Sodium 132 (*)    Glucose, Bld 136 (*)    Calcium 8.8 (*)    Total Bilirubin 1.3 (*)    All other components within normal limits  CBC WITH DIFFERENTIAL/PLATELET - Abnormal; Notable for the following:    WBC 15.5 (*)    Hemoglobin 11.1 (*)    HCT 34.2 (*)    Neutro Abs 12.7 (*)    Monocytes Absolute 1.6 (*)    All other components within normal limits  URINE CULTURE   MDM   Final diagnoses:   Pyelonephritis    Hx and w/u c/w pyelonephritis.  No indication of renal calculus.  Pt does not want pelvic exam.  Explained risk/benefits given explanation for suprapubic pain.  Pt will follow up with PCP prn and get an exam if symptoms persist following Tx.    Rocephin, IVF, antiemetics and pain medication have improved symptoms.  Pt tolerating PO.  Will d/c with pain medications, ABX, and antiemetics.  Patient and family given return precautions for pyelonephritis and abdominal pain.  Pt advised on use of medications as applicable.  Advised to return for actely worsening symptoms, inability to take medications, or other acute concerns.  Advised to follow up with PCP in 2-3 days.  Patient and family in agreement with and expressed understanding of follow plan, plan of care, and return precautions.  All questions answered prior to discharge.  Patient was discharged in stable condition, ambulating without difficulty.  Patient care was discussed with my  attending, Dr. Rhunette Croft.    Gavin Pound, MD 09/24/15 1657  Derwood Kaplan, MD 10/10/15 1600

## 2015-09-23 NOTE — ED Provider Notes (Addendum)
CSN: [id number]     Arrival date & time 09/23/15  1801 History   First MD Initiated Contact with Patient 09/23/15 1913     Chief Complaint  Patient presents with  . Fever   (Consider location/radiation/quality/duration/timing/severity/associated sxs/prior Treatment) Patient is a 39 y.o. female presenting with dysuria.  Dysuria Pain quality:  Burning Pain severity:  Moderate Onset quality:  Sudden Duration:  5 days Progression:  Worsening Chronicity:  New Recent urinary tract infections: no   Urinary symptoms: frequent urination   Associated symptoms: fever and flank pain   Associated symptoms comment:  Fever, chills onset today,also with nausea.   Past Medical History  Diagnosis Date  . Abnormal Pap smear 1996  . SVD (spontaneous vaginal delivery)     x 1  . Termination of pregnancy     x 3  . GERD (gastroesophageal reflux disease)     otc prn with pregnancy  . Headache(784.0)     otc meds prn   Past Surgical History  Procedure Laterality Date  . Cervical biopsy  w/ loop electrode excision  05/1995  . Cesarean section N/A 06/25/2013    Procedure: Primary cesarean section with delivery of baby girl at 1632. Apgars1/7/8;  Surgeon: Philip Aspen, DO;  Location: [facility];  Service: Obstetrics;  Laterality: N/A;  . Cesarean section with bilateral tubal ligation Bilateral 06/20/2014    Procedure: REPEAT CESAREAN SECTION WITH BILATERAL TUBAL LIGATION;  Surgeon: Loney Laurence, MD;  Location: [facility];  Service: Obstetrics;  Laterality: Bilateral;   History reviewed. No pertinent family history. Social History  Substance Use Topics  . Smoking status: Former Smoker    Types: Cigars    Quit date: 10/06/2012  . Smokeless tobacco: Never Used  . Alcohol Use: No   OB History    Gravida Para Term Preterm AB TAB SAB Ectopic Multiple Living   Review of Systems  Constitutional: Positive for fever and chills.  Genitourinary: Positive for dysuria and flank  pain.    Allergies  Sulfa antibiotics  Home Medications   Prior to Admission medications   Medication Sig Start Date End Date Taking? Authorizing Provider  acetaminophen (TYLENOL) 325 MG tablet Take 650 mg by mouth every 6 (six) hours as needed for mild pain or headache.     Historical Provider, MD  calcium carbonate (TUMS - DOSED IN MG ELEMENTAL CALCIUM) 500 MG chewable tablet Chew 2 tablets by mouth daily as needed for indigestion or heartburn.    Historical Provider, MD  oxyCODONE-acetaminophen (PERCOCET/ROXICET) 5-325 MG per tablet Take 1-2 tablets by mouth every 4 (four) hours as needed for severe pain (moderate - severe pain). 06/22/14   Philip Aspen, DO  Prenatal Vit-Fe Fumarate-FA (PRENATAL MULTIVITAMIN) TABS Take 1 tablet by mouth daily at 12 noon.    Historical Provider, MD   Meds Ordered and Administered this Visit  Medications - No data to display  BP 121/78 mmHg  Pulse 128  Temp(Src) 102 F (38.9 C) (Oral)  Resp 16  SpO2 97%  LMP 09/23/2015 No data found.   Physical Exam  Constitutional: She is oriented to person, place, and time. She appears well-developed and well-nourished. She appears distressed.  HENT:  Right Ear: External ear normal.  Left Ear: External ear normal.  Mouth/Throat: Oropharynx is clear and moist.  Neck: Normal range of motion. Neck supple.  Cardiovascular: Normal rate.   Pulmonary/Chest: Effort normal and breath sounds  normal.  Abdominal: Soft. Bowel sounds are normal. She exhibits no mass. There is tenderness in the suprapubic area. There is CVA tenderness. There is no rigidity, no rebound and no guarding.  Neurological: She is alert and oriented to person, place, and time.  Skin: Skin is warm and dry.  Nursing note and vitals reviewed.   ED Course  Procedures (including critical care time)  Labs Review Labs Reviewed  POCT URINALYSIS DIP (DEVICE) - Abnormal; Notable for the following:    Glucose, UA 100 (*)    Hgb urine dipstick  MODERATE (*)    Protein, ur 100 (*)    Nitrite POSITIVE (*)    Leukocytes, UA LARGE (*)    All other components within normal limits  URINE CULTURE  POCT PREGNANCY, URINE   upreg neg U/a abnl. Imaging Review No results found.   Visual Acuity Review  Right Eye Distance:   Left Eye Distance:   Bilateral Distance:    Right Eye Near:   Left Eye Near:    Bilateral Near:         MDM   1. Pyelonephritis    Sent for ivf and abx for acute pyelonephritis.    Linna HoffJames D [place] Pettengill, MD 09/23/15 1941  Linna HoffJames D Beata Beason, MD 09/23/15 [phone]

## 2015-09-23 NOTE — Discharge Instructions (Signed)

## 2015-09-23 NOTE — ED Notes (Signed)
Pt coming from UC with c/o lower back pain. UC dx pt with kidney infection and [facility]

## 2015-09-25 ENCOUNTER — Encounter (HOSPITAL_BASED_OUTPATIENT_CLINIC_OR_DEPARTMENT_OTHER): Payer: Self-pay | Admitting: Emergency Medicine

## 2015-09-26 LAB — URINE CULTURE: Culture: >=100000

## 2015-09-27 ENCOUNTER — Telehealth (HOSPITAL_BASED_OUTPATIENT_CLINIC_OR_DEPARTMENT_OTHER): Payer: Self-pay | Admitting: Emergency Medicine

## 2015-09-27 NOTE — Telephone Encounter (Signed)
Post ED Visit - Positive Culture Follow-up  Culture report reviewed by antimicrobial stewardship pharmacist:  []  Celedonio MiyamotoJeremy Frens, Pharm.D., BCPS []  Georgina PillionElizabeth Martin, Pharm.D., BCPS []  [place]h Pham, [place]D., BCPS, AAHIVP []  Estella HuskMichelle Turner, Pharm.D., BCPS, AAHIVP []  [place]y Reyes, [address]Pharm[address]D. []  Tennis Mustassie Stewart, Pharm.D. Sandi CarneNick Gazda PharmD  Positive urine culture E. coli Treated with cephalexin, organism sensitive to the same and no further patient follow-up is required at this time.  Berle MullMiller, Jadelin Eng 09/27/2015, 11:04 AM

## 2016-06-26 ENCOUNTER — Encounter (HOSPITAL_COMMUNITY): Payer: Self-pay | Admitting: Emergency Medicine

## 2016-06-26 ENCOUNTER — Ambulatory Visit (HOSPITAL_COMMUNITY)
Admission: EM | Admit: 2016-06-26 | Discharge: 2016-06-26 | Disposition: A | Discharge status: Home / Self Care | Payer: BLUE CROSS/BLUE SHIELD | Attending: Emergency Medicine | Admitting: Emergency Medicine

## 2016-06-26 DIAGNOSIS — N390 Urinary tract infection, site not specified: Secondary | ICD-10-CM | POA: Diagnosis not present

## 2016-06-26 LAB — POCT URINALYSIS DIP (DEVICE)
Bilirubin Urine: NEGATIVE
Glucose, UA: NEGATIVE mg/dL
KETONES UR: NEGATIVE mg/dL
Nitrite: NEGATIVE
PROTEIN: NEGATIVE mg/dL
UROBILINOGEN UA: 1 mg/dL (ref 0.0–1.0)
pH: 6.5 (ref 5.0–8.0)

## 2016-06-26 LAB — POCT PREGNANCY, URINE: Preg Test, Ur: NEGATIVE

## 2016-06-26 MED ORDER — NITROFURANTOIN MONOHYD MACRO 100 MG PO CAPS: 100.0000 mg | ORAL_CAPSULE | Freq: Two times a day (BID) | ORAL | 0 refills | Status: DC

## 2016-06-26 NOTE — Discharge Instructions (Signed)
You have a urinary tract infection. Antibiotics should take care of this, if not please f/u. Drink lots of fluids. Feel better.

## 2016-06-26 NOTE — ED Triage Notes (Signed)
The patient presented to the [facility] with a complaint of bilateral flank pain and dysuria x 2 days.

## 2016-06-26 NOTE — ED Provider Notes (Signed)
CSN: [id number]     Arrival date & time 06/26/16  1657 History   First MD Initiated Contact with Patient 06/26/16 1752     Chief Complaint  Patient presents with  . Flank Pain  . Dysuria   (Consider location/radiation/quality/duration/timing/severity/associated sxs/prior Treatment) 40 yo female presents with dysuria and flank pain x 2 days. No fever or chills.    The history is provided by the patient.    Past Medical History:  Diagnosis Date  . Abnormal Pap smear 1996  . GERD (gastroesophageal reflux disease)    otc prn with pregnancy  . Headache(784.0)    otc meds prn  . SVD (spontaneous vaginal delivery)    x 1  . Termination of pregnancy    x 3   Past Surgical History:  Procedure Laterality Date  . CERVICAL BIOPSY  W/ LOOP ELECTRODE EXCISION  05/1995  . CESAREAN SECTION N/A 06/25/2013   Procedure: Primary cesarean section with delivery of baby girl at 1632. Apgars1/7/8;  Surgeon: Philip Aspen, DO;  Location: [facility];  Service: Obstetrics;  Laterality: N/A;  . CESAREAN SECTION WITH BILATERAL TUBAL LIGATION Bilateral 06/20/2014   Procedure: REPEAT CESAREAN SECTION WITH BILATERAL TUBAL LIGATION;  Surgeon: Loney Laurence, MD;  Location: [facility];  Service: Obstetrics;  Laterality: Bilateral;   History reviewed. No pertinent family history. Social History  Substance Use Topics  . Smoking status: Former Smoker    Types: Cigars    Quit date: 10/06/2012  . Smokeless tobacco: Never Used  . Alcohol use No   OB History    Gravida Para Term Preterm AB Living   6 3 3   3 3    SAB TAB Ectopic Multiple Live Births     3           Review of Systems  Constitutional: Negative for chills and fever.  Genitourinary: Positive for dysuria, flank pain, frequency and urgency. Negative for genital sores, pelvic pain and vaginal bleeding.  Musculoskeletal: Positive for back pain.    Allergies  Sulfa antibiotics  Home Medications   Prior to Admission medications   Medication Sig  Start Date End Date Taking? Authorizing Provider  acetaminophen (TYLENOL) 325 MG tablet Take 650 mg by mouth every 6 (six) hours as needed for mild pain or headache.     Historical Provider, MD  calcium carbonate (TUMS - DOSED IN MG ELEMENTAL CALCIUM) 500 MG chewable tablet Chew 2 tablets by mouth daily as needed for indigestion or heartburn.    Historical Provider, MD  cephALEXin (KEFLEX) 500 MG capsule Take 1 capsule (500 mg total) by mouth 4 (four) times daily. 09/23/15   Gavin Pound, MD  HYDROcodone-acetaminophen (NORCO/VICODIN) 5-325 MG tablet Take 1 tablet by mouth every 4 (four) hours as needed. 09/23/15   Gavin Pound, MD  ibuprofen (ADVIL,MOTRIN) 400 MG tablet Take 1 tablet (400 mg total) by mouth every 6 (six) hours as needed. 09/23/15   Gavin Pound, MD  nitrofurantoin, macrocrystal-monohydrate, (MACROBID) 100 MG capsule Take 1 capsule (100 mg total) by mouth 2 (two) times daily. 06/26/16   Riki Sheer, PA-C  ondansetron (ZOFRAN) 4 MG tablet Take 1 tablet (4 mg total) by mouth every 6 (six) hours. 09/23/15   Gavin Pound, MD   Meds Ordered and Administered this Visit  Medications - No data to display  BP 125/88 (BP Location: Left Arm)   Pulse 79   Temp 98.2 F (36.8 C) (Oral)   Resp 12   LMP 05/30/2016 (Exact Date)  SpO2 100%  No data found.   Physical Exam  Constitutional: She appears well-developed and well-nourished. No distress.  Pulmonary/Chest: Effort normal.  Abdominal: There is tenderness.  Skin: Skin is warm. She is not diaphoretic.  Psychiatric: Her behavior is normal.  Nursing note and vitals reviewed.   Urgent Care Course   Clinical Course    Procedures (including critical care time)  Labs Review Labs Reviewed  POCT URINALYSIS DIP (DEVICE) - Abnormal; Notable for the following:       Result Value   Hgb urine dipstick TRACE (*)    Leukocytes, UA LARGE (*)    All other components within normal limits  POCT PREGNANCY, URINE    Imaging  Review No results found.   Visual Acuity Review  Right Eye Distance:   Left Eye Distance:   Bilateral Distance:    Right Eye Near:   Left Eye Near:    Bilateral Near:         MDM   1. UTI (lower urinary tract infection)    Treat with antibiotics, hydration. F/U if needed.     Riki Sheer, PA-C 06/26/16 1824

## 2016-10-13 DIAGNOSIS — T3695XA Adverse effect of unspecified systemic antibiotic, initial encounter: Secondary | ICD-10-CM | POA: Diagnosis not present

## 2016-10-13 DIAGNOSIS — B379 Candidiasis, unspecified: Secondary | ICD-10-CM | POA: Diagnosis not present

## 2016-10-13 DIAGNOSIS — J0100 Acute maxillary sinusitis, unspecified: Secondary | ICD-10-CM | POA: Diagnosis not present

## 2017-03-11 DIAGNOSIS — L638 Other alopecia areata: Secondary | ICD-10-CM | POA: Diagnosis not present

## 2017-03-16 DIAGNOSIS — R05 Cough: Secondary | ICD-10-CM | POA: Diagnosis not present

## 2017-03-16 DIAGNOSIS — J029 Acute pharyngitis, unspecified: Secondary | ICD-10-CM | POA: Diagnosis not present

## 2017-03-16 DIAGNOSIS — J069 Acute upper respiratory infection, unspecified: Secondary | ICD-10-CM | POA: Diagnosis not present

## 2017-03-19 ENCOUNTER — Other Ambulatory Visit (HOSPITAL_COMMUNITY)
Admission: RE | Admit: 2017-03-19 | Discharge: 2017-03-19 | Disposition: A | Discharge status: Home / Self Care | Payer: BLUE CROSS/BLUE SHIELD | Source: Ambulatory Visit | Attending: Obstetrics and Gynecology | Admitting: Obstetrics and Gynecology

## 2017-03-19 ENCOUNTER — Other Ambulatory Visit: Payer: Self-pay | Admitting: Obstetrics and Gynecology

## 2017-03-19 DIAGNOSIS — R61 Generalized hyperhidrosis: Secondary | ICD-10-CM | POA: Diagnosis not present

## 2017-03-19 DIAGNOSIS — Z01419 Encounter for gynecological examination (general) (routine) without abnormal findings: Secondary | ICD-10-CM | POA: Diagnosis not present

## 2017-03-19 DIAGNOSIS — Z1151 Encounter for screening for human papillomavirus (HPV): Secondary | ICD-10-CM | POA: Insufficient documentation

## 2017-03-19 DIAGNOSIS — Z01411 Encounter for gynecological examination (general) (routine) with abnormal findings: Secondary | ICD-10-CM | POA: Diagnosis not present

## 2017-03-19 DIAGNOSIS — R5383 Other fatigue: Secondary | ICD-10-CM | POA: Diagnosis not present

## 2017-03-19 DIAGNOSIS — Z113 Encounter for screening for infections with a predominantly sexual mode of transmission: Secondary | ICD-10-CM | POA: Diagnosis not present

## 2017-03-23 LAB — CYTOLOGY - PAP
Diagnosis: NEGATIVE
HPV: NOT DETECTED

## 2017-04-28 DIAGNOSIS — Z113 Encounter for screening for infections with a predominantly sexual mode of transmission: Secondary | ICD-10-CM | POA: Diagnosis not present

## 2017-04-28 DIAGNOSIS — R5383 Other fatigue: Secondary | ICD-10-CM | POA: Diagnosis not present

## 2017-04-28 DIAGNOSIS — R61 Generalized hyperhidrosis: Secondary | ICD-10-CM | POA: Diagnosis not present

## 2017-12-30 DIAGNOSIS — N924 Excessive bleeding in the premenopausal period: Secondary | ICD-10-CM | POA: Diagnosis not present

## 2017-12-30 DIAGNOSIS — Z30430 Encounter for insertion of intrauterine contraceptive device: Secondary | ICD-10-CM | POA: Diagnosis not present

## 2018-02-10 DIAGNOSIS — T8332XA Displacement of intrauterine contraceptive device, initial encounter: Secondary | ICD-10-CM | POA: Diagnosis not present

## 2018-02-10 DIAGNOSIS — Z30431 Encounter for routine checking of intrauterine contraceptive device: Secondary | ICD-10-CM | POA: Diagnosis not present

## 2018-03-21 DIAGNOSIS — Z01411 Encounter for gynecological examination (general) (routine) with abnormal findings: Secondary | ICD-10-CM | POA: Diagnosis not present

## 2020-01-09 DIAGNOSIS — R8271 Bacteriuria: Secondary | ICD-10-CM | POA: Insufficient documentation

## 2020-01-09 DIAGNOSIS — O99810 Abnormal glucose complicating pregnancy: Secondary | ICD-10-CM | POA: Insufficient documentation

## 2020-01-09 DIAGNOSIS — N760 Acute vaginitis: Secondary | ICD-10-CM | POA: Insufficient documentation

## 2020-01-12 ENCOUNTER — Other Ambulatory Visit (HOSPITAL_BASED_OUTPATIENT_CLINIC_OR_DEPARTMENT_OTHER): Payer: Self-pay | Admitting: *Deleted

## 2020-01-12 DIAGNOSIS — R1011 Right upper quadrant pain: Secondary | ICD-10-CM

## 2020-01-13 ENCOUNTER — Ambulatory Visit (HOSPITAL_BASED_OUTPATIENT_CLINIC_OR_DEPARTMENT_OTHER)
Admission: RE | Admit: 2020-01-13 | Discharge: 2020-01-13 | Disposition: A | Discharge status: Home / Self Care | Payer: 59 | Source: Ambulatory Visit | Attending: *Deleted | Admitting: *Deleted

## 2020-01-13 ENCOUNTER — Other Ambulatory Visit: Payer: Self-pay

## 2020-01-13 DIAGNOSIS — R1011 Right upper quadrant pain: Secondary | ICD-10-CM | POA: Diagnosis not present

## 2020-03-11 ENCOUNTER — Other Ambulatory Visit: Payer: Self-pay | Admitting: Obstetrics and Gynecology

## 2020-03-11 DIAGNOSIS — Z1231 Encounter for screening mammogram for malignant neoplasm of breast: Secondary | ICD-10-CM

## 2020-04-18 ENCOUNTER — Other Ambulatory Visit: Payer: Self-pay | Admitting: Family Medicine

## 2020-04-18 DIAGNOSIS — M79674 Pain in right toe(s): Secondary | ICD-10-CM

## 2020-04-19 ENCOUNTER — Other Ambulatory Visit: Payer: Self-pay | Admitting: Family Medicine

## 2020-04-19 DIAGNOSIS — M79671 Pain in right foot: Secondary | ICD-10-CM

## 2020-04-26 ENCOUNTER — Ambulatory Visit
Admission: RE | Admit: 2020-04-26 | Discharge: 2020-04-26 | Disposition: A | Discharge status: Home / Self Care | Payer: 59 | Source: Ambulatory Visit | Attending: Obstetrics and Gynecology | Admitting: Obstetrics and Gynecology

## 2020-04-26 ENCOUNTER — Other Ambulatory Visit: Payer: Self-pay

## 2020-04-26 DIAGNOSIS — Z1231 Encounter for screening mammogram for malignant neoplasm of breast: Secondary | ICD-10-CM

## 2021-03-01 ENCOUNTER — Encounter (HOSPITAL_COMMUNITY): Payer: Self-pay | Admitting: Emergency Medicine

## 2021-03-01 ENCOUNTER — Emergency Department (HOSPITAL_COMMUNITY): Payer: 59

## 2021-03-01 ENCOUNTER — Emergency Department (HOSPITAL_COMMUNITY)
Admission: EM | Admit: 2021-03-01 | Discharge: 2021-03-01 | Disposition: A | Discharge status: Home / Self Care | Payer: 59 | Attending: Emergency Medicine | Admitting: Emergency Medicine

## 2021-03-01 ENCOUNTER — Other Ambulatory Visit: Payer: Self-pay

## 2021-03-01 DIAGNOSIS — K219 Gastro-esophageal reflux disease without esophagitis: Secondary | ICD-10-CM | POA: Insufficient documentation

## 2021-03-01 DIAGNOSIS — R1011 Right upper quadrant pain: Secondary | ICD-10-CM | POA: Insufficient documentation

## 2021-03-01 DIAGNOSIS — Z87891 Personal history of nicotine dependence: Secondary | ICD-10-CM | POA: Insufficient documentation

## 2021-03-01 LAB — COMPREHENSIVE METABOLIC PANEL
ALT: 33 U/L (ref 0–44)
AST: 28 U/L (ref 15–41)
Albumin: 4.3 g/dL (ref 3.5–5.0)
Alkaline Phosphatase: 80 U/L (ref 38–126)
Anion gap: 9 (ref 5–15)
BUN: 11 mg/dL (ref 6–20)
CO2: 25 mmol/L (ref 22–32)
Calcium: 9.7 mg/dL (ref 8.9–10.3)
Chloride: 104 mmol/L (ref 98–111)
Creatinine, Ser: 0.72 mg/dL (ref 0.44–1.00)
GFR, Estimated: >60 mL/min (ref >60)
Glucose, Bld: 90 mg/dL (ref 70–99)
Potassium: 3.8 mmol/L (ref 3.5–5.1)
Sodium: 138 mmol/L (ref 135–145)
Total Bilirubin: 0.8 mg/dL (ref 0.3–1.2)
Total Protein: 7.7 g/dL (ref 6.5–8.1)

## 2021-03-01 LAB — CBC WITH DIFFERENTIAL/PLATELET
Abs Immature Granulocytes: 0.04 10*3/uL (ref 0.00–0.07)
Basophils Absolute: 0 10*3/uL (ref 0.0–0.1)
Basophils Relative: 0 %
Eosinophils Absolute: 0.4 10*3/uL (ref 0.0–0.5)
Eosinophils Relative: 4 %
HCT: 45.7 % (ref 36.0–46.0)
Hemoglobin: 14.6 g/dL (ref 12.0–15.0)
Immature Granulocytes: 0 %
Lymphocytes Relative: 26 %
Lymphs Abs: 2.4 10*3/uL (ref 0.7–4.0)
MCH: 29 pg (ref 26.0–34.0)
MCHC: 31.9 g/dL (ref 30.0–36.0)
MCV: 90.7 fL (ref 80.0–100.0)
Monocytes Absolute: 0.7 10*3/uL (ref 0.1–1.0)
Monocytes Relative: 8 %
Neutro Abs: 5.9 10*3/uL (ref 1.7–7.7)
Neutrophils Relative %: 62 %
Platelets: 281 10*3/uL (ref 150–400)
RBC: 5.04 MIL/uL (ref 3.87–5.11)
RDW: 12.6 % (ref 11.5–15.5)
WBC: 9.5 10*3/uL (ref 4.0–10.5)
nRBC: 0 % (ref 0.0–0.2)

## 2021-03-01 LAB — URINALYSIS, ROUTINE W REFLEX MICROSCOPIC
Bilirubin Urine: NEGATIVE
Glucose, UA: NEGATIVE mg/dL
Ketones, ur: NEGATIVE mg/dL
Leukocytes,Ua: NEGATIVE
Nitrite: NEGATIVE
Protein, ur: NEGATIVE mg/dL
Specific Gravity, Urine: 1.013 (ref 1.005–1.030)
pH: 5 (ref 5.0–8.0)

## 2021-03-01 LAB — LIPASE, BLOOD: Lipase: 31 U/L (ref 11–51)

## 2021-03-01 NOTE — ED Triage Notes (Signed)
Patient reports intermittent RUQ pain since yesterday. Hx H pylori. Denies N/V/D.

## 2021-03-01 NOTE — ED Provider Notes (Signed)
[facility] DEPT Provider Note   CSN: [id number] Arrival date & time: 03/01/21  1152     History Chief Complaint  Patient presents with  . Abdominal Pain    Cynthia Garcia is a 45 y.o. female.  HPI   Patient with significant medical history of GERD, H. pylori infection presents with chief complaint of right upper quadrant pain.  She endorses pain started on suddenly around 9 PM last night, she describes as a sharp cutting-like sensation in her right upper quadrant does not radiate, not associated with nausea, vomiting, diarrhea, constipation, no urinary symptoms.  Patient has no significant abdominal history, no history of kidney stones, stomach ulcers, gallbladder or liver abnormalities, pancreatitis, no history of AAA, connective tissue disorder,  denies nicotine or alcohol use.  Patient states the pain lasted approximately 3 hours and ended abruptly around 11 PM last night pain started up again around this morning after she ate breakfast which consisted of sausage and eggs.  She denies position make the pain worse, not associated with pleuritic chest pain, denies systemic infection like fever, chills, no congestion, sore throat or cough.  Patient denies alleviating factors.  Patient denies headaches, fevers, chills, shortness of breath, chest pain, nausea, vomiting, diarrhea, worsening pedal edema.  Past Medical History:  Diagnosis Date  . Abnormal Pap smear 1996  . GERD (gastroesophageal reflux disease)    otc prn with pregnancy  . Headache(784.0)    otc meds prn  . SVD (spontaneous vaginal delivery)    x 1  . Termination of pregnancy    x 3    Patient Active Problem List   Diagnosis Date Noted  . Postoperative state 06/20/2014  . S/P cesarean section 06/20/2014    Past Surgical History:  Procedure Laterality Date  . CERVICAL BIOPSY  W/ LOOP ELECTRODE EXCISION  05/1995  . CESAREAN SECTION N/A 06/25/2013   Procedure: Primary cesarean section with  delivery of baby girl at 1632. Apgars1/7/8;  Surgeon: Allyn Kenner, DO;  Location: [facility];  Service: Obstetrics;  Laterality: N/A;  . CESAREAN SECTION WITH BILATERAL TUBAL LIGATION Bilateral 06/20/2014   Procedure: REPEAT CESAREAN SECTION WITH BILATERAL TUBAL LIGATION;  Surgeon: Daria Pastures, MD;  Location: [facility];  Service: Obstetrics;  Laterality: Bilateral;     OB History    Gravida  6   Para  3   Term  3   Preterm      AB  3   Living  3     SAB      IAB  3   Ectopic      Multiple      Live Births  3           Family History  Problem Relation Age of Onset  . Breast cancer Paternal Aunt     Social History   Tobacco Use  . Smoking status: Former Smoker    Types: Cigars    Quit date: 10/06/2012    Years since quitting: 8.4  . Smokeless tobacco: Never Used  Substance Use Topics  . Alcohol use: No  . Drug use: No    Home Medications Prior to Admission medications   Medication Sig Start Date End Date Taking? Authorizing Provider  acetaminophen (TYLENOL) 325 MG tablet Take 650 mg by mouth every 6 (six) hours as needed for mild pain or headache.     [provider]  calcium carbonate (TUMS - DOSED IN MG ELEMENTAL CALCIUM) 500 MG chewable tablet  Chew 2 tablets by mouth daily as needed for indigestion or heartburn.    [provider]  cephALEXin (KEFLEX) 500 MG capsule Take 1 capsule (500 mg total) by mouth 4 (four) times daily. 09/23/15   Hoyle Sauer, MD  HYDROcodone-acetaminophen (NORCO/VICODIN) 5-325 MG tablet Take 1 tablet by mouth every 4 (four) hours as needed. 09/23/15   Hoyle Sauer, MD  ibuprofen (ADVIL,MOTRIN) 400 MG tablet Take 1 tablet (400 mg total) by mouth every 6 (six) hours as needed. 09/23/15   Hoyle Sauer, MD  nitrofurantoin, macrocrystal-monohydrate, (MACROBID) 100 MG capsule Take 1 capsule (100 mg total) by mouth 2 (two) times daily. 06/26/16   Bjorn Pippin, PA-C  ondansetron (ZOFRAN) 4 MG tablet Take 1  tablet (4 mg total) by mouth every 6 (six) hours. 09/23/15   Hoyle Sauer, MD    Allergies    Sulfa antibiotics  Review of Systems   Review of Systems  Constitutional: Negative for chills and fever.  HENT: Negative for congestion and sore throat.   Eyes: Negative for visual disturbance.  Respiratory: Negative for shortness of breath.   Cardiovascular: Negative for chest pain.  Gastrointestinal: Positive for abdominal pain. Negative for diarrhea, nausea and vomiting.  Genitourinary: Negative for enuresis and flank pain.  Musculoskeletal: Negative for back pain.  Skin: Negative for rash.  Neurological: Negative for dizziness and headaches.  Hematological: Does not bruise/bleed easily.    Physical Exam Updated Vital Signs BP 130/85 (BP Location: Left Arm)   Pulse 79   Temp 98.9 F (37.2 C) (Oral)   Resp 18   SpO2 100%   Physical Exam Vitals and nursing note reviewed.  Constitutional:      General: She is not in acute distress.    Appearance: She is not ill-appearing.  HENT:     Head: Normocephalic and atraumatic.     Nose: No congestion.  Eyes:     Conjunctiva/sclera: Conjunctivae normal.  Cardiovascular:     Rate and Rhythm: Normal rate and regular rhythm.     Pulses: Normal pulses.     Heart sounds: No murmur heard. No friction rub. No gallop.   Pulmonary:     Effort: No respiratory distress.     Breath sounds: No wheezing, rhonchi or rales.  Abdominal:     Palpations: Abdomen is soft.     Tenderness: There is abdominal tenderness. There is no right CVA tenderness or left CVA tenderness.     Comments: Patient's abdomen was visualized, is nondistended, normoactive bowel sounds, dull to percussion, she had noted right upper quadrant pain, negative Murphy sign, negative McBurney point, no rebound tenderness or peritoneal sign.  She had no CVA tenderness.  Musculoskeletal:     Comments: Spine was palpated nontender to palpation.  Negative straight leg raise.   Skin:    General: Skin is warm and dry.  Neurological:     Mental Status: She is alert.  Psychiatric:        Mood and Affect: Mood normal.     ED Results / Procedures / Treatments   Labs (all labs ordered are listed, but only abnormal results are displayed) Labs Reviewed  URINALYSIS, ROUTINE W REFLEX MICROSCOPIC - Abnormal; Notable for the following components:      Result Value   Hgb urine dipstick SMALL (*)    Bacteria, UA RARE (*)    All other components within normal limits  COMPREHENSIVE METABOLIC PANEL  LIPASE, BLOOD  CBC WITH DIFFERENTIAL/PLATELET    EKG None  Radiology US Abdomen Limited  Result Date: 03/01/2021 CLINICAL DATA:  RIGHT upper quadrant pain. EXAM: ULTRASOUND ABDOMEN LIMITED RIGHT UPPER QUADRANT COMPARISON:  None. FINDINGS: Gallbladder: No gallstones or wall thickening visualized. No sonographic Murphy sign noted by sonographer. Common bile duct: Diameter: Normal at 3 mm Liver: No focal lesion identified. Within normal limits in parenchymal echogenicity. Portal vein is patent on color Doppler imaging with normal direction of blood flow towards the liver. Other: No ascites IMPRESSION: Normal RIGHT upper quadrant ultrasound. Electronically Signed   By: Suzy Bouchard M.D.   On: 03/01/2021 15:13   DG Chest Port 1 View  Result Date: 03/01/2021 CLINICAL DATA:  Right upper quadrant pain EXAM: PORTABLE CHEST 1 VIEW COMPARISON:  None. FINDINGS: The heart size and mediastinal contours are within normal limits. Both lungs are clear. The visualized skeletal structures are unremarkable. IMPRESSION: No active disease. Electronically Signed   By: Dorise Bullion III M.D   On: 03/01/2021 14:33    Procedures Procedures   Medications Ordered in ED Medications - No data to display  ED Course  I have reviewed the triage vital signs and the nursing notes.  Pertinent labs & imaging results that were available during my care of the patient were reviewed by me and  considered in my medical decision making (see chart for details).    MDM Rules/Calculators/A&P                         Initial impression-patient presents with right upper quadrant pain.  She is alert, does not appear in acute distress and vital signs reassuring.  Concern for possible gallbladder abnormality will obtain basic lab work, UA, chest x-ray for further evaluation.  Work-up-CBC unremarkable, CMP unremarkable, UA shows no nitrates leukocytes or red blood cells.  Lipase is 31.  Limited abdominal ultrasound negative, chest x-ray negative.  Rule out-I have low suspicion for perforated stomach ulcer as abdomen is nondistended, normoactive bowel sounds, dull to percussion no peritoneal signs on my exam.  Low suspicion for gallbladder or liver abnormality as there is no elevation in alk phos or liver enzymes, limited ultrasound negative for acute findings.  Low suspicion for Pilo or kidney stones as there is no CVA tenderness, UA is negative for signs infection or hematuria.  No suspicion for AAA as patient is low risk factors, no bulging mass.  Low suspicion for systemic infection as patient nontoxic-appearing, vital signs reassuring, no obvious source infection on my exam.  Plan-patient has right upper quadrant pain with unknown etiology differential diagnosis includes muscular strain, GERD, constipation.  Patient has a history of GERD suspect this may be exacerbation of GERD will start her on a PPI and have her follow-up with GI for further evaluation.  Vital signs have remained stable, no indication for hospital admission.  Patient given at home care as well strict return precautions.  Patient verbalized that they understood agreed to said plan.   Final Clinical Impression(s) / ED Diagnoses Final diagnoses:  Right upper quadrant abdominal pain    Rx / DC Orders ED Discharge Orders    None       Marcello Fennel, PA-C 03/01/21 1549    Lucrezia Starch, MD 03/03/21 [phone]

## 2021-03-01 NOTE — Discharge Instructions (Addendum)
Your lab work and imaging look reassuring, would like you to start taking Pepcid and abstaining from foods that are acidic, spicy, carbonated drinks, alcohol as these can worsen your abdominal pain.  I would like you to follow-up with your GI doctor for further evaluation.  Come back to the emergency department if you develop chest pain, shortness of breath, severe abdominal pain, uncontrolled nausea, vomiting, diarrhea.

## 2021-12-25 IMAGING — MG DIGITAL SCREENING BILAT W/ TOMO W/ CAD
8 series · 8 of 24 positions shown · non-contrast
Comparison: None.

CLINICAL DATA: Screening.

EXAM:
DIGITAL SCREENING BILATERAL MAMMOGRAM WITH TOMO AND CAD

[R MLO synth-2D]
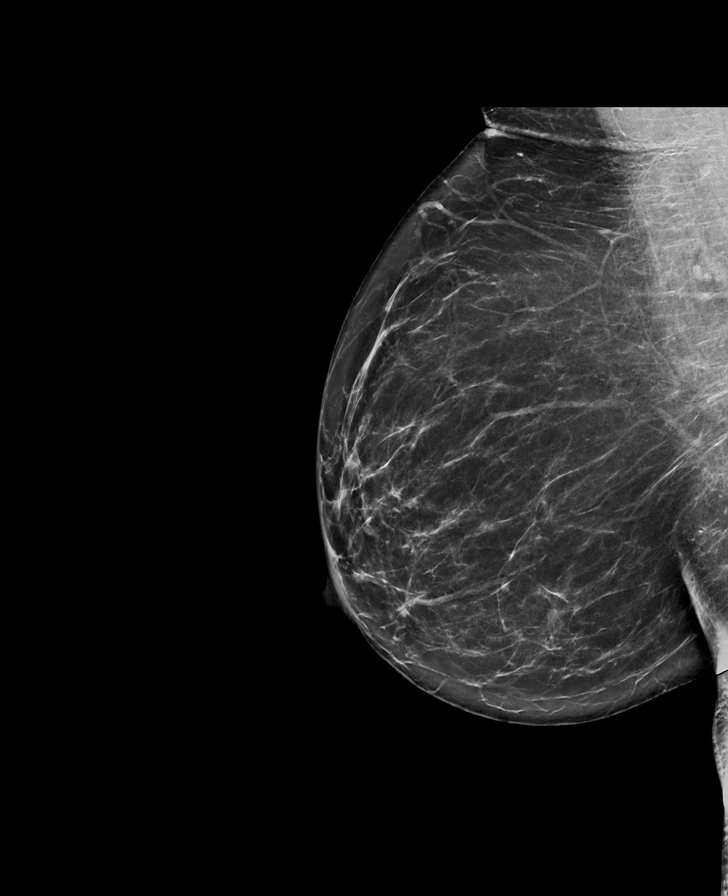

[R CC synth-2D]
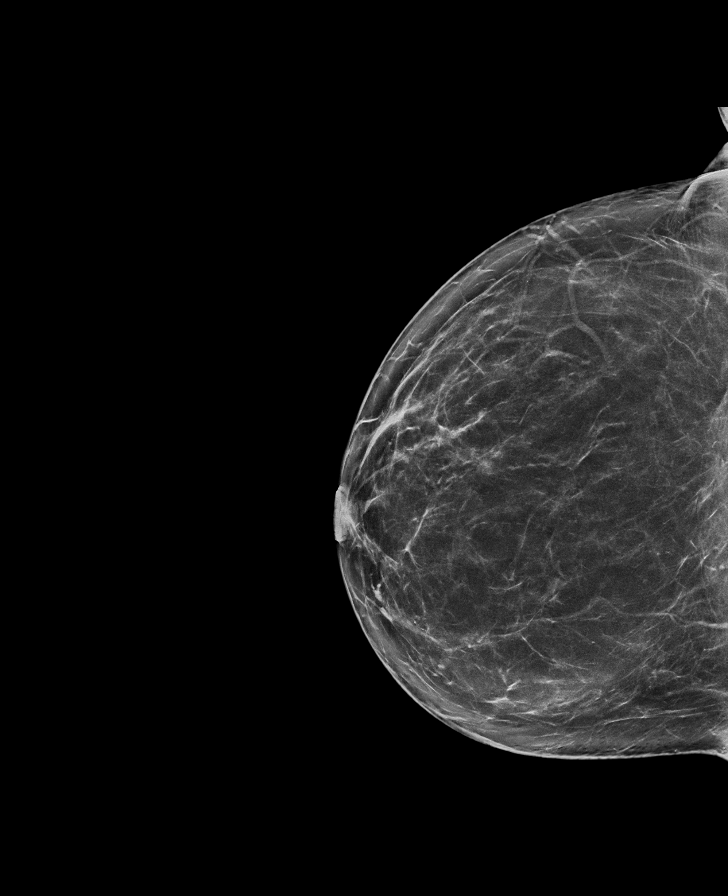

[L CC synth-2D]
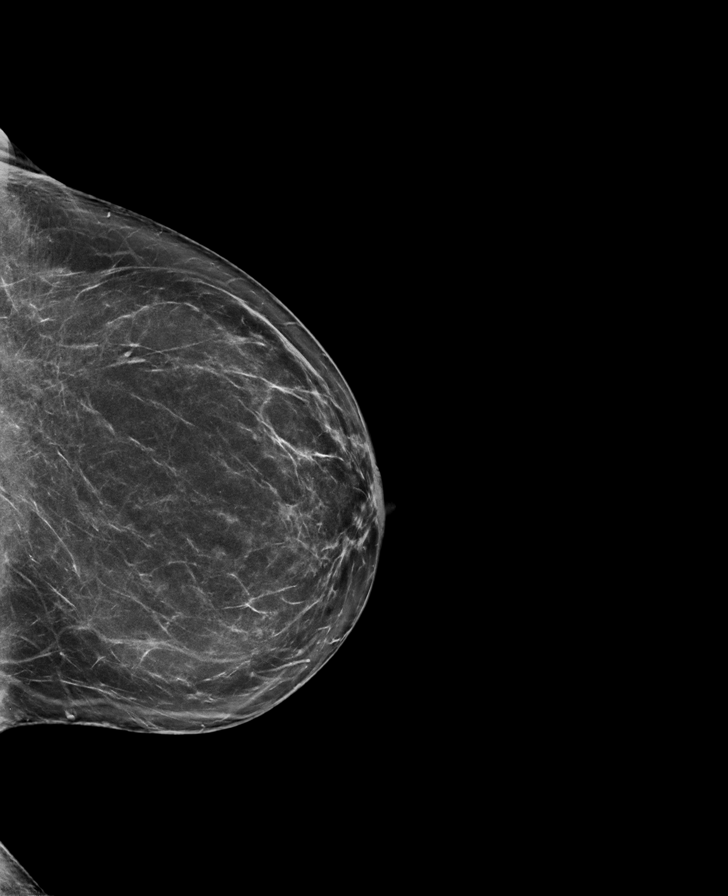

[L MLO synth-2D]
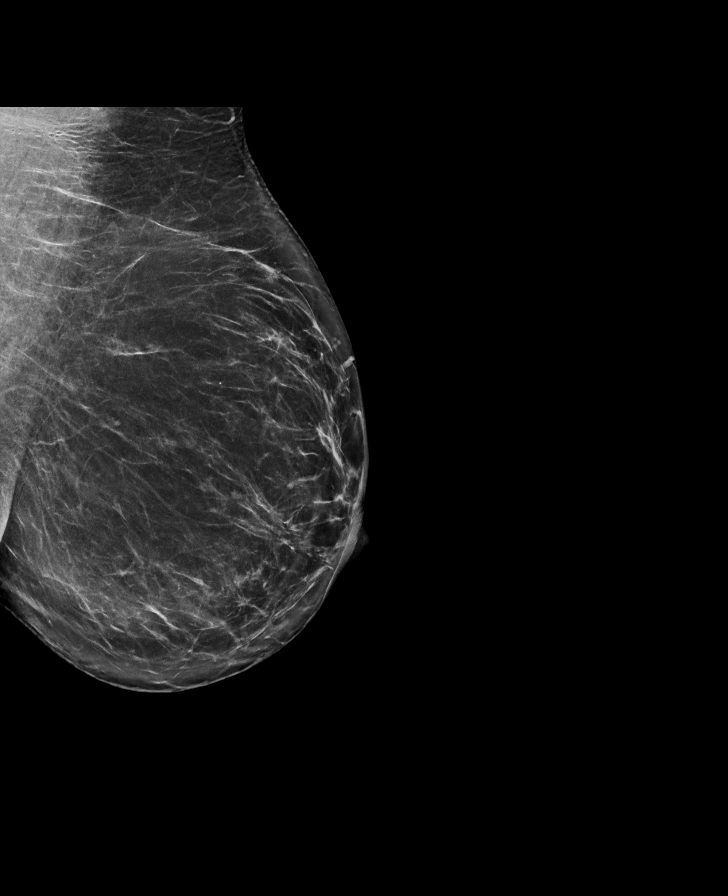

[L MLO tomo · tomo slice 39/77.0]
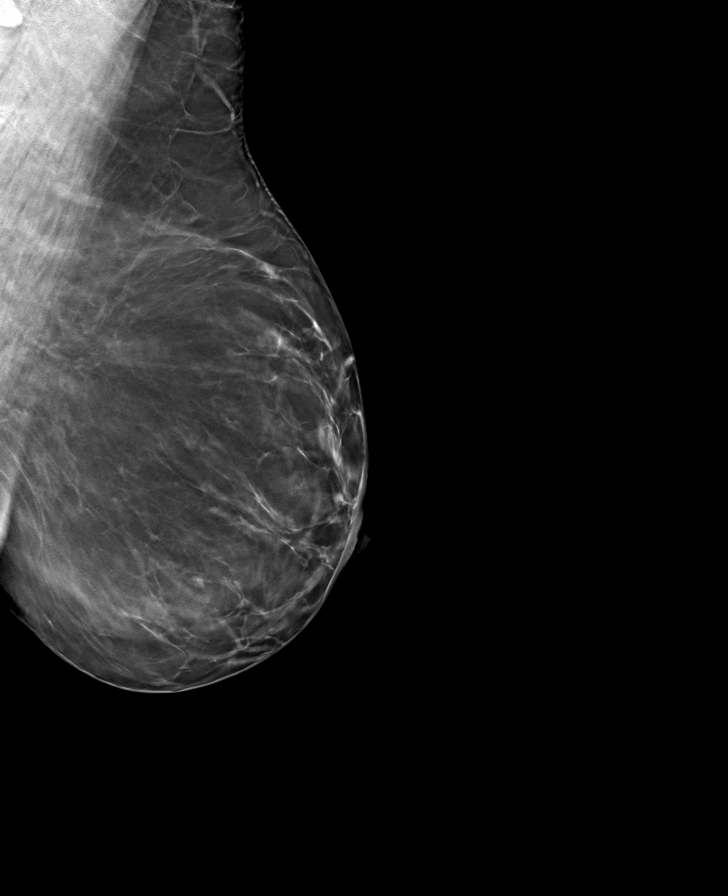

[R CC tomo · tomo slice 39/76.0]
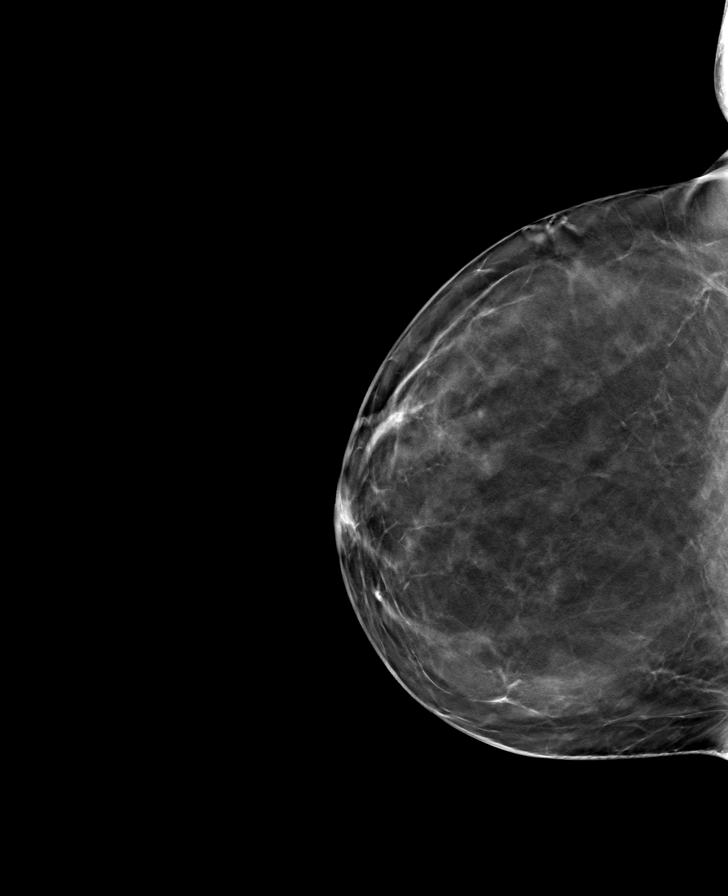

[R MLO tomo · tomo slice 40/79.0]
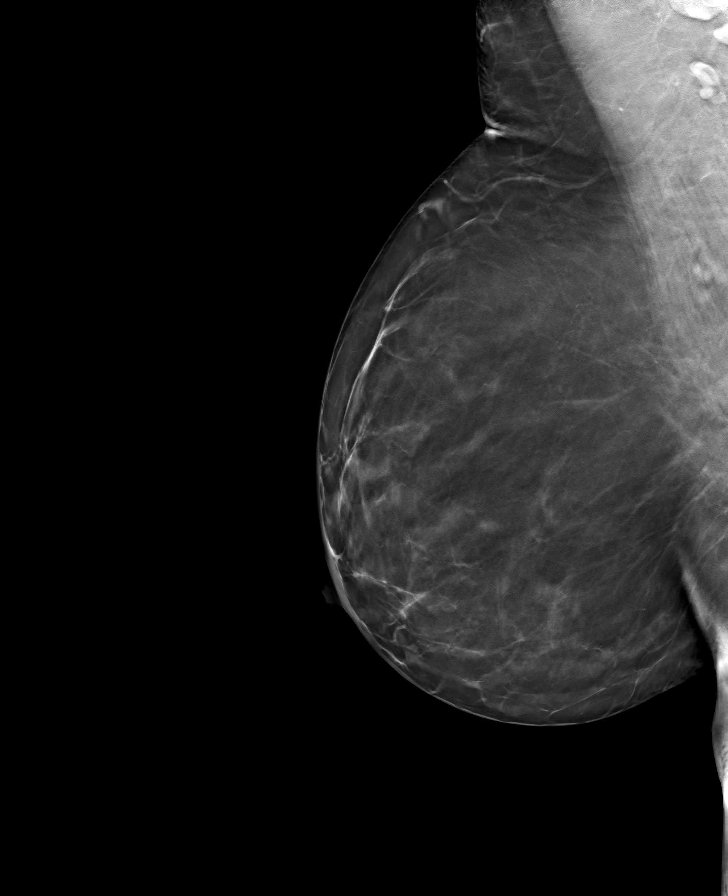

[L CC tomo · tomo slice 38/75.0]
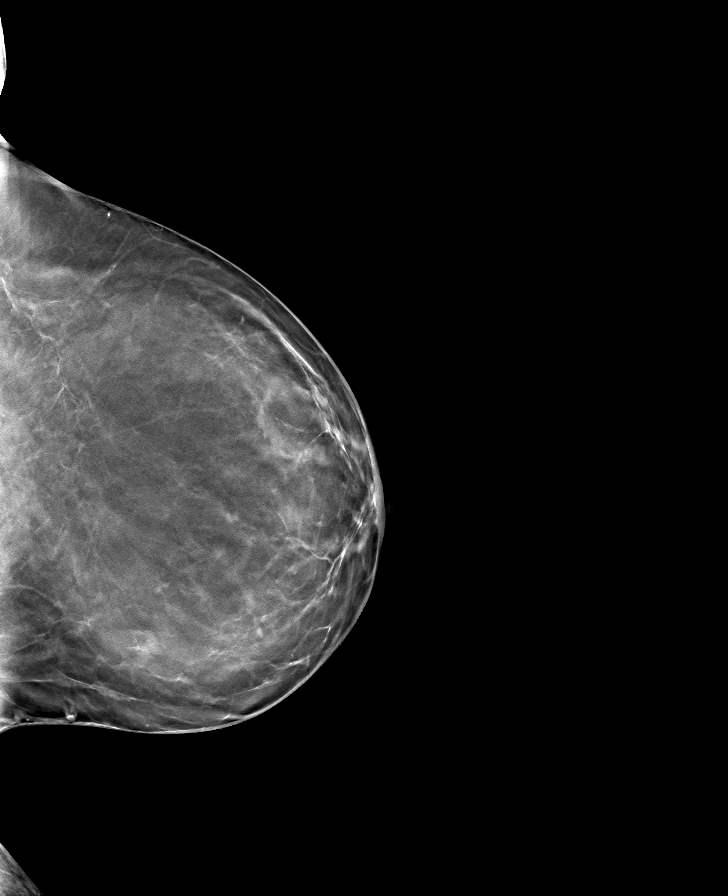

[8 of 24 positions shown; findings below may reference images not displayed]

ACR Breast Density Category b: There are scattered areas of
fibroglandular density.
FINDINGS: There are no findings suspicious for malignancy. Images were
processed with CAD.
IMPRESSION: No mammographic evidence of malignancy. A result letter of this
screening mammogram will be mailed directly to the patient.

RECOMMENDATION:
Screening mammogram in one year. (Code:Y5-G-EJ6)

BI-RADS CATEGORY  1: Negative.

## 2022-08-12 ENCOUNTER — Other Ambulatory Visit: Payer: Self-pay | Admitting: Physician Assistant

## 2022-08-12 DIAGNOSIS — R42 Dizziness and giddiness: Secondary | ICD-10-CM

## 2022-08-12 DIAGNOSIS — R519 Headache, unspecified: Secondary | ICD-10-CM

## 2022-08-12 DIAGNOSIS — H538 Other visual disturbances: Secondary | ICD-10-CM

## 2022-08-29 ENCOUNTER — Ambulatory Visit
Admission: RE | Admit: 2022-08-29 | Discharge: 2022-08-29 | Disposition: A | Discharge status: Home / Self Care | Payer: Self-pay | Source: Ambulatory Visit | Attending: Physician Assistant | Admitting: Physician Assistant

## 2022-08-29 DIAGNOSIS — R42 Dizziness and giddiness: Secondary | ICD-10-CM

## 2022-08-29 DIAGNOSIS — H538 Other visual disturbances: Secondary | ICD-10-CM

## 2022-08-29 DIAGNOSIS — R519 Headache, unspecified: Secondary | ICD-10-CM

## 2022-08-29 MED ORDER — GADOBENATE DIMEGLUMINE 529 MG/ML IV SOLN
14.0000 mL | Freq: Once | INTRAVENOUS | Status: AC | PRN
  Administered 2022-08-29: 14 mL via INTRAVENOUS

## 2022-09-01 ENCOUNTER — Encounter: Payer: Self-pay | Admitting: *Deleted

## 2022-09-01 ENCOUNTER — Other Ambulatory Visit: Payer: Self-pay | Admitting: *Deleted

## 2022-09-01 NOTE — Progress Notes (Signed)
Referring:  Delma Officer, PA [address] [place],  [place] [address]  PCP: Delma Officer, [place]  Neurology was asked to evaluate Cynthia Garcia, a 46 year old female for a chief complaint of headaches.  Our recommendations of care will be communicated by shared medical record.    CC:  headaches  History provided from self  HPI:  Medical co-morbidities: GERD, vitamin D deficiency  The patient presents for evaluation of headaches. She has had migraines for most of her life. Previously she would have 2-3 migraines per month, but headaches became daily in August 2023. Notes increased stress around that time. She also got new glasses around May and wonders if eye strain may have contributed.  Migraines are described as unilateral pressure with associated photophobia, phonophobia, and visual aura. They can last 3-4 days without medication. PCP started propranolol 1.5 weeks ago which has helped reduce the severity of her headaches. She takes Excedrin as needed which only helps if she takes it quickly enough. Tried Imitrex previously, but this made her drowsy.  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  Headache History: Onset: August 2023 Triggers: chocolate, weather changes Aura: lights in vision Location: unilateral (left or right) Quality/Description: pressure Associated Symptoms:  Photophobia: yes  Phonophobia: yes  Nausea: no Worse with activity?: yes Duration of headaches: 1-2 hours with Excedrin, 3-4 days without medication  Headache days per month: 30 Migraine days per month: 8 Headache free days per month: 0  Current Treatment: Abortive Excedrin  Preventative Propranolol 60 mg daily  Prior Therapies                                 Propranolol 60 mg daily Imitrex - drowsiness Excedrin Zofran 4 mg PRN   LABS: 06/26/22: CBC, CMP unremarkable  IMAGING:  MRI brain 08/29/22: No evidence of acute intracranial  abnormality.   There are a few nonspecific punctate T2 FLAIR hyperintense remote insults scattered within the bilateral cerebral white matter.   6 mm pineal cyst without suspicious masslike or nodular enhancement.   Otherwise unremarkable MRI appearance of the brain.  Imaging independently reviewed on September 02, 2022   Current Outpatient Medications on File Prior to Visit  Medication Sig Dispense Refill   aspirin-acetaminophen-caffeine (EXCEDRIN MIGRAINE) 250-250-65 MG tablet Take by mouth every 6 (six) hours as needed for headache.     calcium carbonate (TUMS - DOSED IN MG ELEMENTAL CALCIUM) 500 MG chewable tablet Chew 2 tablets by mouth daily as needed for indigestion or heartburn.     famotidine (PEPCID) 10 MG tablet Take 10 mg by mouth 2 (two) times daily.     ibuprofen (ADVIL,MOTRIN) 400 MG tablet Take 1 tablet (400 mg total) by mouth every 6 (six) hours as needed. 30 tablet 0   propranolol ER (INDERAL LA) 60 MG 24 hr capsule Take 60 mg by mouth daily.     No current facility-administered medications on file prior to visit.     Allergies: Allergies  Allergen Reactions   Bee Venom Hives   Other Swelling    wine   Aleve [Naproxen] Itching   Sulfa Antibiotics Other (See Comments)    Her father died from Lurline Hare syndrone from Sulfa    Family History: Migraine or other headaches in the family:  no Aneurysms in a first degree relative:  no Brain tumors in the family:  no Other neurological illness in the family:   no  Past Medical History: Past Medical History:  Diagnosis Date   Abnormal Pap smear 11/30/1994   Anxiety    GERD (gastroesophageal reflux disease)    otc prn with pregnancy   Headache(784.0)    otc meds prn   SVD (spontaneous vaginal delivery)    x 1   Termination of pregnancy    x 3    Past Surgical History Past Surgical History:  Procedure Laterality Date   CERVICAL BIOPSY  W/ LOOP ELECTRODE EXCISION  05/1995   CESAREAN SECTION N/A  06/25/2013   Procedure: Primary cesarean section with delivery of baby girl at 1632. Apgars1/7/8;  Surgeon: Philip Aspen, DO;  Location: [facility];  Service: Obstetrics;  Laterality: N/A;   CESAREAN SECTION WITH BILATERAL TUBAL LIGATION Bilateral 06/20/2014   Procedure: REPEAT CESAREAN SECTION WITH BILATERAL TUBAL LIGATION;  Surgeon: Loney Laurence, MD;  Location: [facility];  Service: Obstetrics;  Laterality: Bilateral;    Social History: Social History   Tobacco Use   Smoking status: Former    Types: Cigars    Quit date: 10/06/2012    Years since quitting: 9.9   Smokeless tobacco: Never  Substance Use Topics   Alcohol use: No   Drug use: No     ROS: Negative for fevers, chills. Positive for headaches. All other systems reviewed and negative unless stated otherwise in HPI.   Physical Exam:   Vital Signs: BP 127/83   Pulse 88   Ht 5\' 1"  (1.549 m)   Wt 157 lb (71.2 kg)   BMI 29.66 kg/m  GENERAL: well appearing,in no acute distress,alert SKIN:  Color, texture, turgor normal. No rashes or lesions HEAD:  Normocephalic/atraumatic. CV:  RRR RESP: Normal respiratory effort MSK: +tenderness to palpation over left occiput, neck, and shoulder  NEUROLOGICAL: Mental Status: Alert, oriented to person, place and time,Follows commands Cranial Nerves: PERRL, visual fields intact to confrontation, extraocular movements intact, facial sensation intact, no facial droop or ptosis, hearing grossly intact, no dysarthria Motor: muscle strength 5/5 both upper and lower extremities Reflexes: 2+ throughout Sensation: intact to light touch all 4 extremities Coordination: Finger-to- nose-finger intact bilaterally Gait: normal-based   IMPRESSION: 46 year old female with a history of GERD, vitamin D deficiency who presents for evaluation of worsening headaches over the past 2 months. 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 Suspect this is an incidental finding and  likely not contributing to her headaches. Neurological exam today is normal. She has had some improvement in her migraines since starting propranolol 1.5 weeks ago. Will start naratriptan for migraine rescue as this may be better tolerated than Imitrex. Offered neck PT for cervicalgia, which she declined at this time.  PLAN: -Preventive: Continue propranolol 60 mg daily -Rescue: Start naratriptan 2.5 mg PRN   I spent a total of 37 minutes chart reviewing and counseling the patient. Headache education was done. Discussed treatment options including preventive and acute medications, and physical therapy. Discussed medication side effects, adverse reactions and drug interactions. Written educational materials and patient instructions outlining all of the above were given.  Follow-up: 3 months   Ocie Doyne, MD 09/02/2022   11:13 AM

## 2022-09-02 ENCOUNTER — Ambulatory Visit: Payer: Managed Care, Other (non HMO) | Admitting: Psychiatry

## 2022-09-02 ENCOUNTER — Encounter: Payer: Self-pay | Admitting: Psychiatry

## 2022-09-02 VITALS — BP 127/83 | HR 88 | Ht 61.0 in | Wt 157.0 lb

## 2022-09-02 DIAGNOSIS — G43E09 Chronic migraine with aura, not intractable, without status migrainosus: Secondary | ICD-10-CM

## 2022-09-02 MED ORDER — NARATRIPTAN HCL 2.5 MG PO TABS: 2.5000 mg | ORAL_TABLET | ORAL | 6 refills | Status: AC | PRN

## 2022-10-30 IMAGING — US US ABDOMEN LIMITED RUQ/ASCITES
1 series · 14 of 25 positions shown · non-contrast
Comparison: None.

CLINICAL DATA: RIGHT upper quadrant pain.

EXAM:
ULTRASOUND ABDOMEN LIMITED RIGHT UPPER QUADRANT

[Series 1: us abdomen limited ruq/ascites · 14 of 29 slices shown]
[im 1/29]
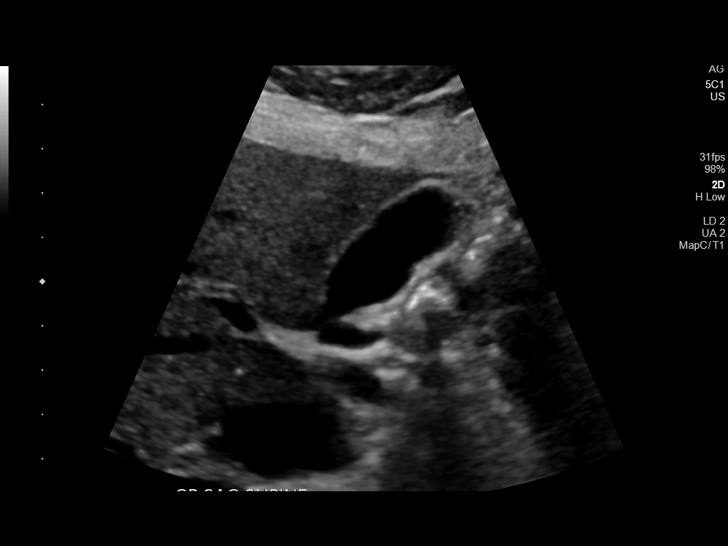
[im 3/29]
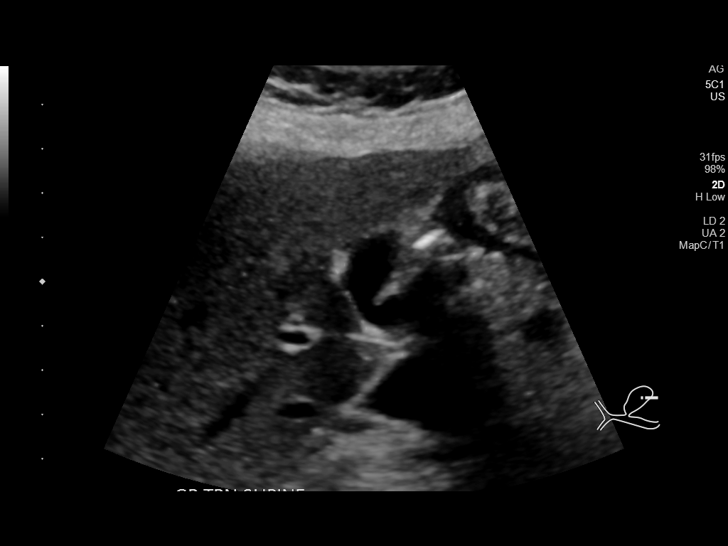
[im 5/29]
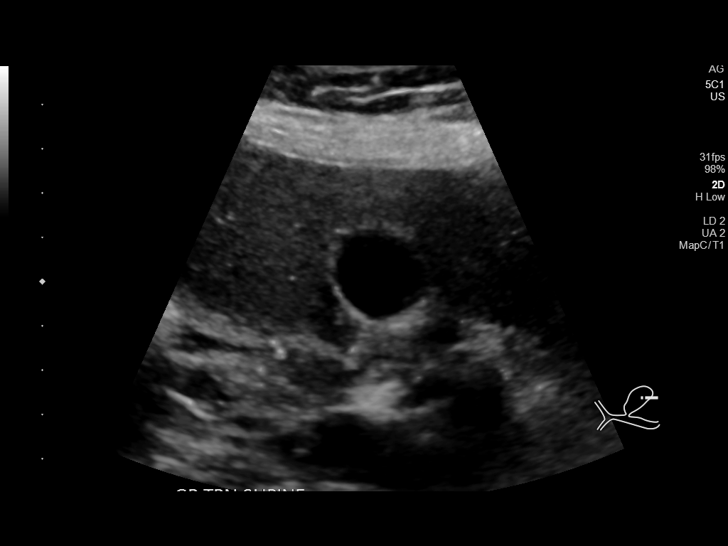
[im 8/29]
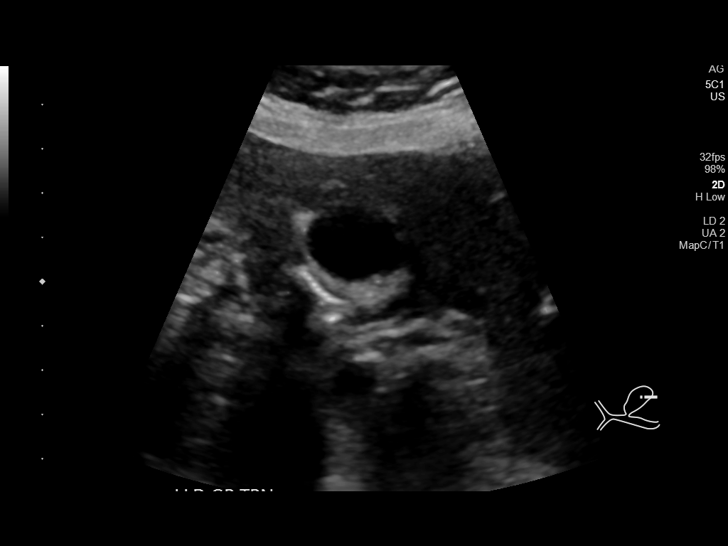
[im 10/29]
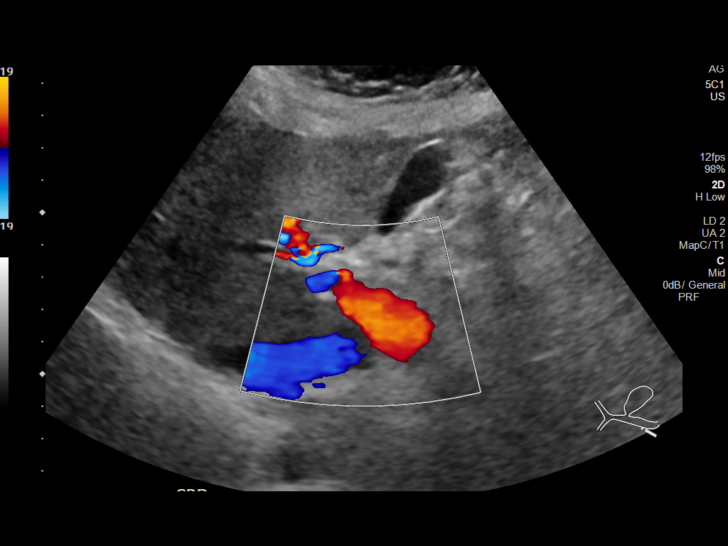
[im 11/29]
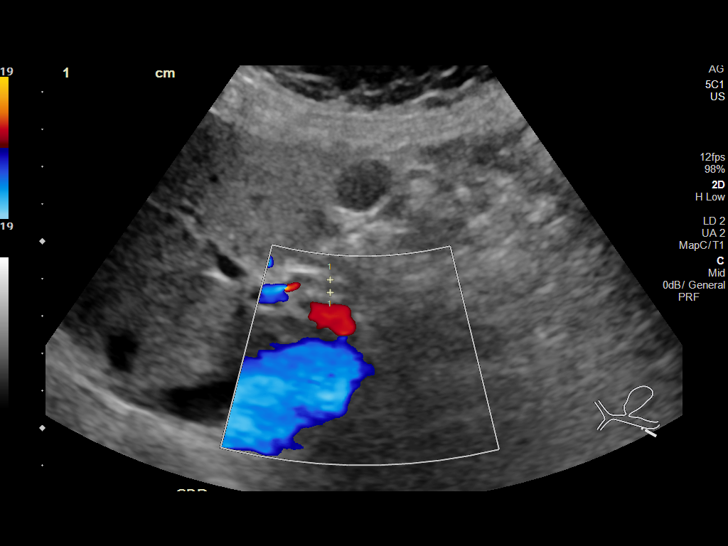
[im 13/29]
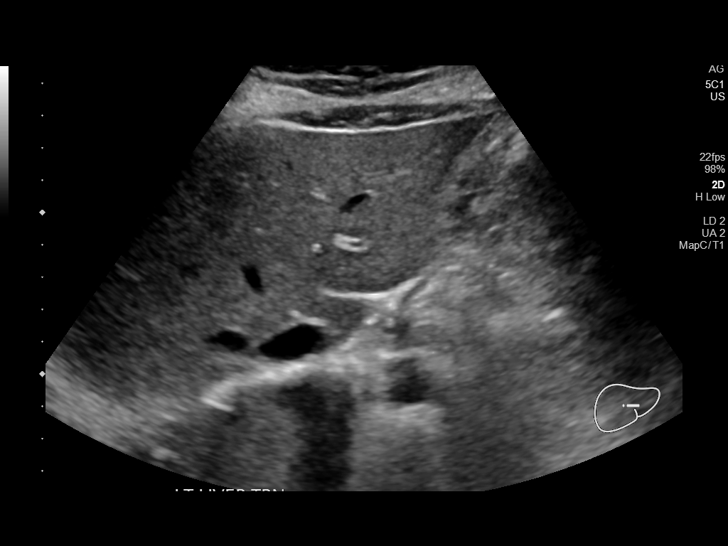
[im 16/29]
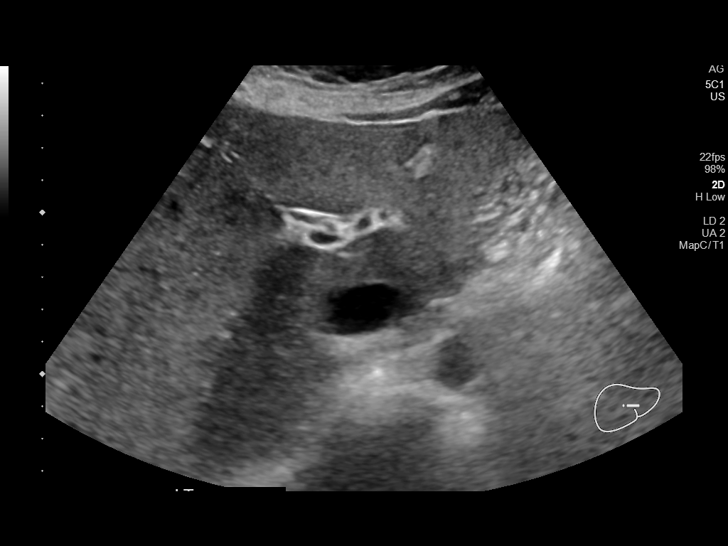
[im 18/29]
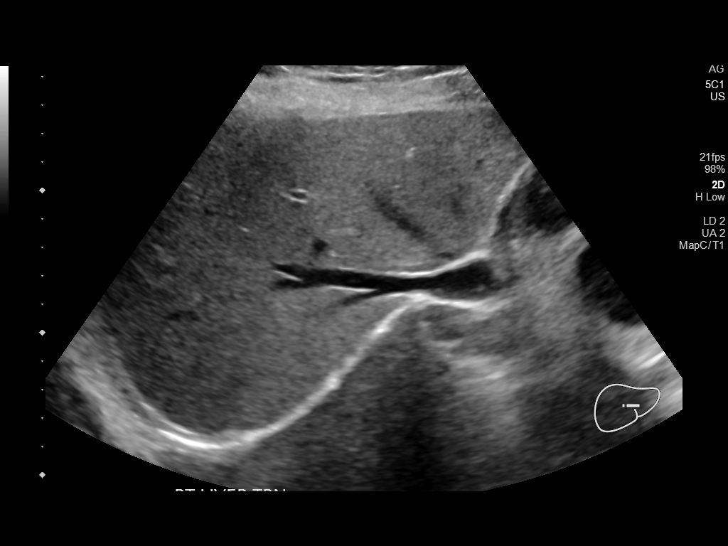
[im 19/29]
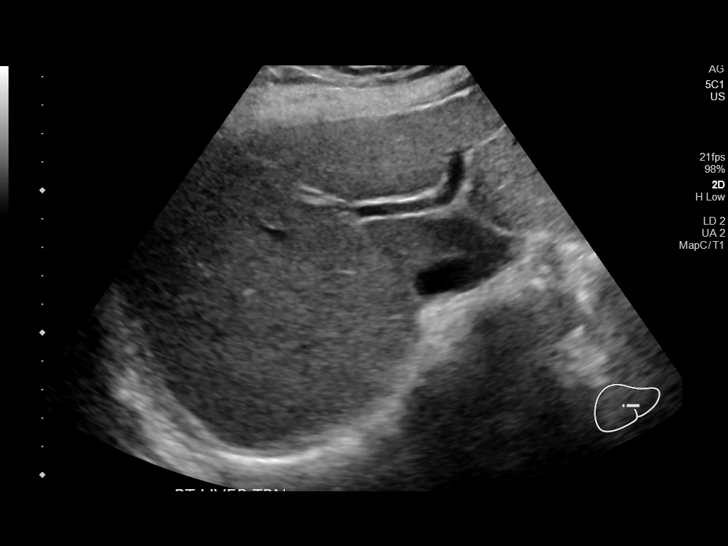
[im 22/29]
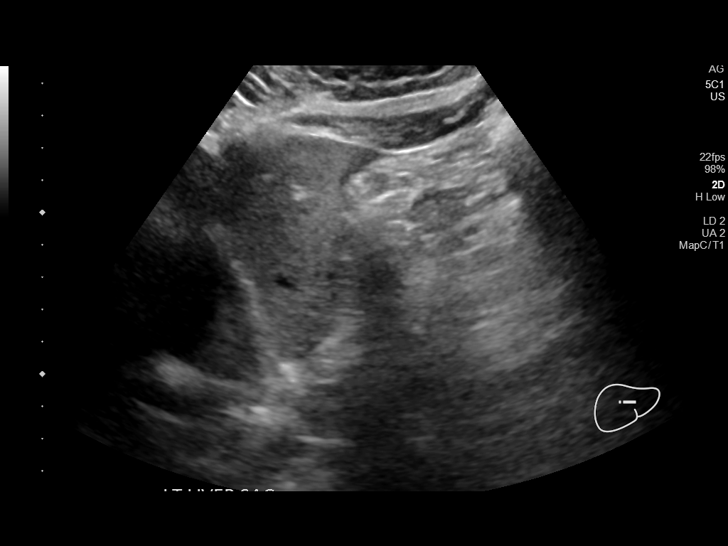
[im 24/29]
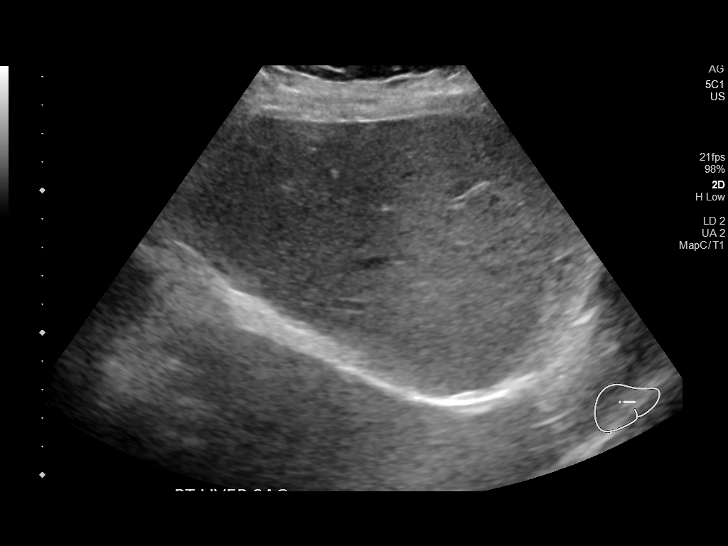
[im 26/29]
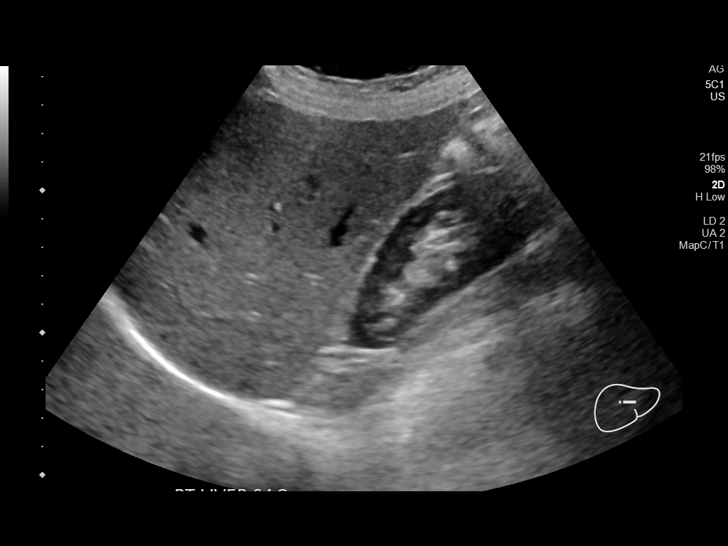
[im 29/29]
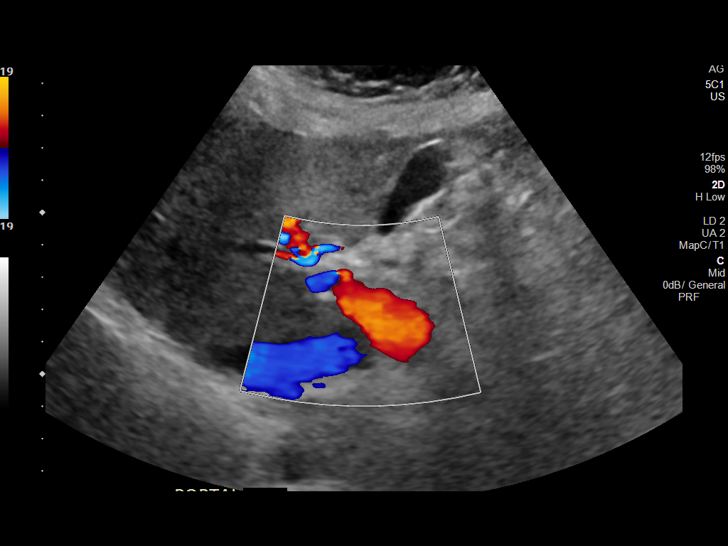

[14 of 25 positions shown; findings below may reference images not displayed]

FINDINGS: Gallbladder:

No gallstones or wall thickening visualized. No sonographic Murphy
sign noted by sonographer.

Common bile duct:

Diameter: Normal at 3 mm

Liver:

No focal lesion identified. Within normal limits in parenchymal
echogenicity. Portal vein is patent on color Doppler imaging with
normal direction of blood flow towards the liver.

Other: No ascites
IMPRESSION: Normal RIGHT upper quadrant ultrasound.

## 2022-12-09 NOTE — Progress Notes (Deleted)
Primary neurologist: Dr. Billey Gosling [id number], Delrae Rend, PA   CC:  headaches  History provided from self  Follow-up visit:   Prior visit: 09/02/2022 with Dr. Billey Gosling    Brief HPI:   Cynthia Garcia is a 47 y.o. female who is followed in office for chronic migraine headaches which gradually worsened around 06/2022 from about 2-3 times per month to daily.  PCP started on propranolol shortly prior to prior visit which helped reduce severity of headaches.   At prior visit, continued on propranolol 60 mg daily for preventative and started on naratriptan for rescue   Interval history:    Medical co-morbidities: GERD, vitamin D deficiency  The patient presents for evaluation of headaches. She has had migraines for most of her life. Previously she would have 2-3 migraines per month, but headaches became daily in August 2023. Notes increased stress around that time. She also got new glasses around May and wonders if eye strain may have contributed.  Migraines are described as unilateral pressure with associated photophobia, phonophobia, and visual aura. They can last 3-4 days without medication. PCP started propranolol 1.5 weeks ago which has helped reduce the severity of her headaches. She takes Excedrin as needed which only helps if she takes it quickly enough. Tried Imitrex previously, but this made her drowsy.  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  Headache History: Onset: August 2023 Triggers: chocolate, weather changes Aura: lights in vision Location: unilateral (left or right) Quality/Description: pressure Associated Symptoms:  Photophobia: yes  Phonophobia: yes  Nausea: no Worse with activity?: yes Duration of headaches: 1-2 hours with Excedrin, 3-4 days without medication  Headache days per month: 30 Migraine days per month: 8 Headache free days per month: 0  Current Treatment: Abortive Excedrin  Preventative Propranolol 60  mg daily  Prior Therapies                                 Propranolol 60 mg daily Imitrex - drowsiness Excedrin Zofran 4 mg PRN   LABS: 06/26/22: CBC, CMP unremarkable  IMAGING:  MRI brain 08/29/22: No evidence of acute intracranial abnormality.   There are a few nonspecific punctate T2 FLAIR hyperintense remote insults scattered within the bilateral cerebral white matter.   6 mm pineal cyst without suspicious masslike or nodular enhancement.   Otherwise unremarkable MRI appearance of the brain.  Imaging independently reviewed on December 09, 2022   Current Outpatient Medications on File Prior to Visit  Medication Sig Dispense Refill   aspirin-acetaminophen-caffeine (EXCEDRIN MIGRAINE) 250-250-65 MG tablet Take by mouth every 6 (six) hours as needed for headache.     calcium carbonate (TUMS - DOSED IN MG ELEMENTAL CALCIUM) 500 MG chewable tablet Chew 2 tablets by mouth daily as needed for indigestion or heartburn.     famotidine (PEPCID) 10 MG tablet Take 10 mg by mouth 2 (two) times daily.     ibuprofen (ADVIL,MOTRIN) 400 MG tablet Take 1 tablet (400 mg total) by mouth every 6 (six) hours as needed. 30 tablet 0   naratriptan (AMERGE) 2.5 MG tablet Take 1 tablet (2.5 mg total) by mouth as needed for migraine. Take one (1) tablet at onset of headache; if returns or does not resolve, may repeat after 4 hours; do not exceed five (5) mg in 24 hours. 10 tablet 6   propranolol ER (INDERAL LA) 60 MG 24 hr  capsule Take 60 mg by mouth daily.     No current facility-administered medications on file prior to visit.     Allergies: Allergies  Allergen Reactions   Bee Venom Hives   Other Swelling    wine   Aleve [Naproxen] Itching   Sulfa Antibiotics Other (See Comments)    Her father died from [place] from Sulfa    Family History: Migraine or other headaches in the family:  no Aneurysms in a first degree relative:  no Brain tumors in the family:  no Other  neurological illness in the family:   no  Past Medical History: Past Medical History:  Diagnosis Date   Abnormal Pap smear 11/30/1994   Anxiety    GERD (gastroesophageal reflux disease)    otc prn with pregnancy   Headache(784.0)    otc meds prn   SVD (spontaneous vaginal delivery)    x 1   Termination of pregnancy    x 3    Past Surgical History Past Surgical History:  Procedure Laterality Date   CERVICAL BIOPSY  W/ LOOP ELECTRODE EXCISION  05/1995   CESAREAN SECTION N/A 06/25/2013   Procedure: Primary cesarean section with delivery of baby girl at 1632. Apgars1/7/8;  Surgeon: Allyn Kenner, DO;  Location: [facility];  Service: Obstetrics;  Laterality: N/A;   CESAREAN SECTION WITH BILATERAL TUBAL LIGATION Bilateral 06/20/2014   Procedure: REPEAT CESAREAN SECTION WITH BILATERAL TUBAL LIGATION;  Surgeon: Daria Pastures, MD;  Location: [facility];  Service: Obstetrics;  Laterality: Bilateral;    Social History: Social History   Tobacco Use   Smoking status: Former    Types: Cigars    Quit date: 10/06/2012    Years since quitting: 10.1   Smokeless tobacco: Never  Substance Use Topics   Alcohol use: No   Drug use: No     ROS: Negative for fevers, chills. Positive for headaches. All other systems reviewed and negative unless stated otherwise in HPI.   Physical Exam:   Vital Signs: There were no vitals taken for this visit. GENERAL: well appearing,in no acute distress,alert SKIN:  Color, texture, turgor normal. No rashes or lesions HEAD:  Normocephalic/atraumatic. CV:  RRR RESP: Normal respiratory effort MSK: +tenderness to palpation over left occiput, neck, and shoulder  NEUROLOGICAL: Mental Status: Alert, oriented to person, place and time,Follows commands Cranial Nerves: PERRL, visual fields intact to confrontation, extraocular movements intact, facial sensation intact, no facial droop or ptosis, hearing grossly intact, no dysarthria Motor: muscle strength 5/5 both  upper and lower extremities Reflexes: 2+ throughout Sensation: intact to light touch all 4 extremities Coordination: Finger-to- nose-finger intact bilaterally Gait: normal-based     IMPRESSION: 47 year old female with a history of GERD, vitamin D deficiency who presented for evaluation of worsening headaches around 06/2022, initial evaluation of Dr. Billey Gosling 09/02/2022. 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 Suspect this is an incidental finding and likely not contributing to her headaches. Neurological exam normal.   She has had some improvement in her migraines since starting propranolol 1.5 weeks ago. Will start naratriptan for migraine rescue as this may be better tolerated than Imitrex. Offered neck PT for cervicalgia, which she declined at this time.   PLAN: -Preventive: Continue propranolol 60 mg daily -Rescue: Start naratriptan 2.5 mg PRN     I spent *** minutes of face-to-face and non-face-to-face time with patient.  This included previsit chart review, lab review, study review, order entry, electronic health  record documentation, patient education  Frann Rider, [facility]  [facility] [address] [place], [place] [address]  Phone [phone] Fax [phone] Note: This document was prepared with digital dictation and possible smart phrase technology. Any transcriptional errors that result from this process are unintentional.

## 2022-12-10 ENCOUNTER — Encounter: Payer: Self-pay | Admitting: Adult Health

## 2022-12-10 ENCOUNTER — Ambulatory Visit: Payer: Self-pay | Admitting: Adult Health

## 2024-03-15 DIAGNOSIS — G4700 Insomnia, unspecified: Secondary | ICD-10-CM | POA: Insufficient documentation

## 2024-03-15 DIAGNOSIS — N951 Menopausal and female climacteric states: Secondary | ICD-10-CM | POA: Insufficient documentation

## 2024-03-15 DIAGNOSIS — N959 Unspecified menopausal and perimenopausal disorder: Secondary | ICD-10-CM | POA: Insufficient documentation

## 2024-03-15 DIAGNOSIS — R419 Unspecified symptoms and signs involving cognitive functions and awareness: Secondary | ICD-10-CM | POA: Insufficient documentation

## 2024-03-15 DIAGNOSIS — E669 Obesity, unspecified: Secondary | ICD-10-CM | POA: Insufficient documentation

## 2024-06-13 ENCOUNTER — Other Ambulatory Visit: Payer: Self-pay | Admitting: Physician Assistant

## 2024-06-13 DIAGNOSIS — R2990 Unspecified symptoms and signs involving the nervous system: Secondary | ICD-10-CM

## 2024-07-10 ENCOUNTER — Other Ambulatory Visit: Payer: Self-pay

## 2024-07-10 ENCOUNTER — Encounter (HOSPITAL_BASED_OUTPATIENT_CLINIC_OR_DEPARTMENT_OTHER): Payer: Self-pay

## 2024-07-10 ENCOUNTER — Emergency Department (HOSPITAL_BASED_OUTPATIENT_CLINIC_OR_DEPARTMENT_OTHER)

## 2024-07-10 ENCOUNTER — Emergency Department (HOSPITAL_BASED_OUTPATIENT_CLINIC_OR_DEPARTMENT_OTHER)
Admission: EM | Admit: 2024-07-10 | Discharge: 2024-07-10 | Disposition: A | Discharge status: Home / Self Care | Attending: Emergency Medicine | Admitting: Emergency Medicine

## 2024-07-10 DIAGNOSIS — N12 Tubulo-interstitial nephritis, not specified as acute or chronic: Secondary | ICD-10-CM | POA: Diagnosis not present

## 2024-07-10 DIAGNOSIS — R1031 Right lower quadrant pain: Secondary | ICD-10-CM | POA: Diagnosis present

## 2024-07-10 LAB — LIPASE, BLOOD: Lipase: 31 U/L (ref 11–51)

## 2024-07-10 LAB — COMPREHENSIVE METABOLIC PANEL WITH GFR
ALT: 34 U/L (ref 0–44)
AST: 31 U/L (ref 15–41)
Albumin: 4.2 g/dL (ref 3.5–5.0)
Alkaline Phosphatase: 85 U/L (ref 38–126)
Anion gap: 16 — ABNORMAL HIGH (ref 5–15)
BUN: 10 mg/dL (ref 6–20)
CO2: 21 mmol/L — ABNORMAL LOW (ref 22–32)
Calcium: 9.7 mg/dL (ref 8.9–10.3)
Chloride: 102 mmol/L (ref 98–111)
Creatinine, Ser: 0.79 mg/dL (ref 0.44–1.00)
GFR, Estimated: >60 mL/min (ref >60)
Glucose, Bld: 104 mg/dL — ABNORMAL HIGH (ref 70–99)
Potassium: 3.6 mmol/L (ref 3.5–5.1)
Sodium: 139 mmol/L (ref 135–145)
Total Bilirubin: 0.4 mg/dL (ref 0.0–1.2)
Total Protein: 7.1 g/dL (ref 6.5–8.1)

## 2024-07-10 LAB — URINALYSIS, ROUTINE W REFLEX MICROSCOPIC
Bilirubin Urine: NEGATIVE
Glucose, UA: NEGATIVE mg/dL
Leukocytes,Ua: NEGATIVE
Nitrite: NEGATIVE
Protein, ur: NEGATIVE mg/dL
Specific Gravity, Urine: 1.019 (ref 1.005–1.030)
pH: 6 (ref 5.0–8.0)

## 2024-07-10 LAB — CBC
HCT: 39.8 % (ref 36.0–46.0)
Hemoglobin: 13.1 g/dL (ref 12.0–15.0)
MCH: 29.5 pg (ref 26.0–34.0)
MCHC: 32.9 g/dL (ref 30.0–36.0)
MCV: 89.6 fL (ref 80.0–100.0)
Platelets: 276 K/uL (ref 150–400)
RBC: 4.44 MIL/uL (ref 3.87–5.11)
RDW: 12.5 % (ref 11.5–15.5)
WBC: 7.6 K/uL (ref 4.0–10.5)
nRBC: 0 % (ref 0.0–0.2)

## 2024-07-10 LAB — PREGNANCY, URINE: Preg Test, Ur: NEGATIVE

## 2024-07-10 MED ORDER — CEFPODOXIME PROXETIL 200 MG PO TABS: 200.0000 mg | ORAL_TABLET | Freq: Two times a day (BID) | ORAL | 0 refills | Status: AC

## 2024-07-10 MED ORDER — IOHEXOL 300 MG/ML  SOLN
100.0000 mL | Freq: Once | INTRAMUSCULAR | Status: AC | PRN
  Administered 2024-07-10 (×2): 100 mL via INTRAVENOUS

## 2024-07-10 MED ORDER — KETOROLAC TROMETHAMINE 15 MG/ML IJ SOLN
15.0000 mg | Freq: Once | INTRAMUSCULAR | Status: AC
  Administered 2024-07-10 (×2): 15 mg via INTRAVENOUS
  Filled 2024-07-10: qty 1

## 2024-07-10 NOTE — ED Notes (Signed)
 Pt in Ct

## 2024-07-10 NOTE — ED Provider Notes (Signed)
 [place] EMERGENCY DEPARTMENT AT [facility] Provider Note   CSN: [id number] Arrival date & time: 07/10/24  1548     Patient presents with: Abdominal Pain   Cynthia Garcia is a 48 y.o. female with no significant past medical history presents with concern for a pain in the right lower back that wraps around into the right lower abdomen.  States this started 5 days ago and has been constant since then.  She reports some nausea but no vomiting.  No fever or chills.  Denies any dysuria, hematuria, increased frequency.  Does note that her urine did seem darker in appearance over the last couple of days.  Denies any abnormal vaginal discharge.  Reports normal bowel movements, last bowel movement was this morning. She has tried Advil  at home for pain which does improve this temporarily.    Abdominal Pain      Prior to Admission medications   Medication Sig Start Date End Date Taking? Authorizing Provider  cefpodoxime  (VANTIN ) 200 MG tablet Take 1 tablet (200 mg total) by mouth 2 (two) times daily for 10 days. 07/10/24 07/20/24 Yes Veta Palma, PA-C  aspirin-acetaminophen -caffeine (EXCEDRIN MIGRAINE) 250-250-65 MG tablet Take by mouth every 6 (six) hours as needed for headache.    [provider]  calcium carbonate (TUMS - DOSED IN MG ELEMENTAL CALCIUM) 500 MG chewable tablet Chew 2 tablets by mouth daily as needed for indigestion or heartburn.    [provider]  famotidine (PEPCID) 10 MG tablet Take 10 mg by mouth 2 (two) times daily.    [provider]  ibuprofen  (ADVIL ,MOTRIN ) 400 MG tablet Take 1 tablet (400 mg total) by mouth every 6 (six) hours as needed. 09/23/15   Michail Soulier, MD  naratriptan  (AMERGE) 2.5 MG tablet Take 1 tablet (2.5 mg total) by mouth as needed for migraine. Take one (1) tablet at onset of headache; if returns or does not resolve, may repeat after 4 hours; do not exceed five (5) mg in 24 hours. 09/02/22   Rush Nest, MD   propranolol ER (INDERAL LA) 60 MG 24 hr capsule Take 60 mg by mouth daily. 08/11/22   [provider]    Allergies: Bee venom, Other, Aleve [naproxen], and Sulfa antibiotics    Review of Systems  Gastrointestinal:  Positive for abdominal pain.    Updated Vital Signs BP 122/73   Pulse 69   Temp 98.6 F (37 C) (Oral)   Resp 18   Ht 5' 1 (1.549 m)   Wt 70.8 kg   SpO2 99%   BMI 29.48 kg/m   Physical Exam Vitals and nursing note reviewed.  Constitutional:      General: She is not in acute distress.    Appearance: She is well-developed.  HENT:     Head: Normocephalic and atraumatic.  Eyes:     Conjunctiva/sclera: Conjunctivae normal.  Cardiovascular:     Rate and Rhythm: Normal rate and regular rhythm.     Heart sounds: No murmur heard. Pulmonary:     Effort: Pulmonary effort is normal. No respiratory distress.     Breath sounds: Normal breath sounds.  Abdominal:     Palpations: Abdomen is soft.     Tenderness: There is abdominal tenderness.     Comments: Tender to palpation in the right lower quadrant and right upper quadrant without rebound or guarding  No CVA tenderness bilaterally  Musculoskeletal:        General: No swelling.     Cervical  back: Neck supple.  Skin:    General: Skin is warm and dry.     Capillary Refill: Capillary refill takes less than 2 seconds.  Neurological:     Mental Status: She is alert.  Psychiatric:        Mood and Affect: Mood normal.     (all labs ordered are listed, but only abnormal results are displayed) Labs Reviewed  COMPREHENSIVE METABOLIC PANEL WITH GFR - Abnormal; Notable for the following components:      Result Value   CO2 21 (*)    Glucose, Bld 104 (*)    Anion gap 16 (*)    All other components within normal limits  URINALYSIS, ROUTINE W REFLEX MICROSCOPIC - Abnormal; Notable for the following components:   Hgb urine dipstick SMALL (*)    Ketones, ur TRACE (*)    Bacteria, UA RARE (*)    All other  components within normal limits  URINE CULTURE  LIPASE, BLOOD  CBC  PREGNANCY, URINE    EKG: None  Radiology: CT ABDOMEN PELVIS W CONTRAST Result Date: 07/10/2024 CLINICAL DATA:  Right lower quadrant pain EXAM: CT ABDOMEN AND PELVIS WITH CONTRAST TECHNIQUE: Multidetector CT imaging of the abdomen and pelvis was performed using the standard protocol following bolus administration of intravenous contrast. RADIATION DOSE REDUCTION: This exam was performed according to the departmental dose-optimization program which includes automated exposure control, adjustment of the mA and/or kV according to patient size and/or use of iterative reconstruction technique. CONTRAST:  OMNIPAQUE  IOHEXOL  300 MG/ML  SOLN COMPARISON:  None Available. FINDINGS: Lower chest: No acute abnormality. Hepatobiliary: No focal liver abnormality is seen. No gallstones, gallbladder wall thickening, or biliary dilatation. Pancreas: Unremarkable. No pancreatic ductal dilatation or surrounding inflammatory changes. Spleen: Normal in size without focal abnormality. Adrenals/Urinary Tract: There is a 3.3 cm right renal cyst. Otherwise, the adrenal glands, kidneys and bladder are within normal limits. Stomach/Bowel: Stomach is within normal limits. Appendix appears normal. No evidence of bowel wall thickening, distention, or inflammatory changes. There is scattered colonic diverticula. Vascular/Lymphatic: No significant vascular findings are present. No enlarged abdominal or pelvic lymph nodes. Reproductive: There is an IUD in the uterus. Bilateral adnexal clips are present. Ovaries are grossly within normal limits. Other: No abdominal wall hernia or abnormality. No abdominopelvic ascites. Musculoskeletal: No acute or significant osseous findings. IMPRESSION: 1. No acute localizing process in the abdomen or pelvis. 2. Colonic diverticulosis without evidence for diverticulitis. 3. Right renal cyst. No follow-up imaging necessary.  Electronically Signed   By: Greig Pique M.D.   On: 07/10/2024 21:52     Procedures   Medications Ordered in the ED  iohexol  (OMNIPAQUE ) 300 MG/ML solution 100 mL (100 mLs Intravenous Contrast Given 07/10/24 1945)  ketorolac  (TORADOL ) 15 MG/ML injection 15 mg (15 mg Intravenous Given 07/10/24 1952)                                    Medical Decision Making Amount and/or Complexity of Data Reviewed Labs: ordered. Radiology: ordered.  Risk Prescription drug management.     Differential diagnosis includes but is not limited to Acute cholecystitis, cholelithiasis, cholangitis, choledocholithiasis, peptic ulcer, gastritis, gastroenteritis, appendicitis, IBS, IBD, DKA, nephrolithiasis, UTI, pyelonephritis, pancreatitis, diverticulitis, mesenteric ischemia, abdominal aortic aneurysm, small bowel obstruction, volvulus, pregnancy related concerns in females of childbearing age    ED Course:  Upon initial evaluation, patient is well-appearing, no acute distress.  Stable vitals.  Does have mild tenderness to the right upper and right lower quadrant on exam.  No rebound or guarding.  No active vomiting.  Labs Ordered: I Ordered, and personally interpreted labs.  The pertinent results include:   CBC without leukocytosis CMP without any elevation in LFTs or creatinine.  Anion gap at 16 Lipase within normal limits Urinalysis with small amount of hemoglobin, trace ketones, and bacteria noted.  No squamous epithelial cells noted.  No nitrates or leukocytes.   Pregnancy negative  Imaging Studies ordered: I ordered imaging studies including CT abdomen pelvis I independently visualized the imaging with scope of interpretation limited to determining acute life threatening conditions related to emergency care.  Imaging showed IMPRESSION: 1. No acute localizing process in the abdomen or pelvis. 2. Colonic diverticulosis without evidence for diverticulitis. 3. Right renal cyst. No follow-up  imaging necessary. I agree with the radiologist interpretation   Medications Given: Toradol   Upon re-evaluation, patient reports pain improved with the Toradol  given.  Discussed that the CT scan did not show any acute abnormalities to explain her pain.  Her lab work is reassuring with normal LFTs, creatinine, lipase.  No leukocytosis, fevers, no concern for sepsis.  Urine did show bacteria, and given patient's report of difference in color and flank pain, will treat for possible pyelonephritis.  Urine sent off for culture.  Will treat with course of cefpodoxime .  I also discussed the incidental finding of right renal cyst on the CT with patient.  Discussed that this does not need any follow-up imaging, but I recommended she make her PCP aware of this finding.  Stable and appropriate for discharge home at this time  Impression: Pyelonephritis  Disposition:  The patient was discharged home with instructions to take course of cefpodoxime  as prescribed.  Follow-up with PCP if symptoms not improved within the next 5 days. Return precautions given.    This chart was dictated using voice recognition software, Dragon. Despite the best efforts of this provider to proofread and correct errors, errors may still occur which can change documentation meaning.       Final diagnoses:  Pyelonephritis    ED Discharge Orders          Ordered    cefpodoxime  (VANTIN ) 200 MG tablet  2 times daily        07/10/24 2211               Veta Palma, PA-C 07/10/24 2235    Bari Roxie HERO, DO 07/12/24 2223

## 2024-07-10 NOTE — Discharge Instructions (Addendum)
 You may have a urinary tract infection.  This could be spreading up to your kidneys and causing the back pain you are experiencing.  You have been prescribed an antibiotic called cefpodoxime .Unfortunately we do not have this in stock in the ER. Start taking this antibiotic tomorrow morning. Take this antibiotic 2 times a day for the next 10 days. Take the full course of your antibiotic even if you start feeling better. Antibiotics may cause you to have diarrhea.  Your urine has been sent off for a culture.  If your antibiotic needs to be changed, you will be contacted.  Rest of your workup was reassuring.  Your kidney, liver, and pancreas labs were normal.  Your electrolytes and blood counts were normal.  Your pregnancy test was negative.  The CT of your of your abdomen did not show any acute abnormalities to explain your pain.  There was a renal cyst noted in your right kidney.  This does not need any follow-up imaging.  Please make your PCP aware of this finding.  Please follow-up with your PCP if your symptoms are not starting to improve within the next 5 days  Please return to the ER if you have severe worsening pain, vomiting, fevers, any other new or concerning symptoms

## 2024-07-10 NOTE — ED Triage Notes (Signed)
 Complaining of pain in the right lower back that radiates to the right lower abdomen. Started on Thursday and is getting worse.

## 2024-07-11 ENCOUNTER — Other Ambulatory Visit

## 2024-07-12 LAB — URINE CULTURE

## 2024-08-05 ENCOUNTER — Other Ambulatory Visit

## 2024-10-12 ENCOUNTER — Ambulatory Visit: Admitting: Neurology

## 2024-10-12 ENCOUNTER — Encounter: Payer: Self-pay | Admitting: Neurology

## 2024-10-12 VITALS — BP 120/82 | HR 67 | Ht 61.0 in | Wt 159.0 lb

## 2024-10-12 DIAGNOSIS — M542 Cervicalgia: Secondary | ICD-10-CM | POA: Diagnosis not present

## 2024-10-12 DIAGNOSIS — R531 Weakness: Secondary | ICD-10-CM | POA: Insufficient documentation

## 2024-10-12 DIAGNOSIS — G43709 Chronic migraine without aura, not intractable, without status migrainosus: Secondary | ICD-10-CM | POA: Insufficient documentation

## 2024-10-12 NOTE — Progress Notes (Signed)
 Chief Complaint  Patient presents with   RM14/WEAKNESS    Pt is here Alone. Pt states that her weakness has been going on for 5-10 years. Pt states that her weakness has become more frequent.     ASSESSMENT AND PLAN  Mariely K Gencarelli is a 48 y.o. female   Intermittent left arm weakness, occasionally with leg involvement leading to fall Chronic neck pain  Brisk reflex, possible Babinski signs  MRI of cervical spine to rule out cervical spondylitic myelopathy/radiculopathy Chronic migraine  Inderal ER 60 mg daily as preventive medication, as needed NSAIDs  Follow-up plan depend on MRI findings DIAGNOSTIC DATA (LABS, IMAGING, TESTING) - I reviewed patient records, labs, notes, testing and imaging myself where available.   MEDICAL HISTORY:  Miral K Schmieder, is a 48 year old female, seen in request by her primary care PA from [facility], [place], for evaluation of intermittent arm weakness  History is obtained from the patient and review of electronic medical records. I personally reviewed pertinent available imaging films in PACS.   PMHx of  Chronic migraine Hormone supplement since May 2025, did help hot flashes and mood swing,  For over 15 years, she has intermittent arm weakness, also happened when she first woke up from overnight sleep, sometimes triggered by neck movement, describes sudden onset abnormal sensation in her cervical region, followed by left arm goes limp, she has to use right arm for support, symptom last about few minutes, would gradually improve,  She also described episode of left leg give up under her, fell in the shower, has to crawl out,  She has chronic low back pain, neck pain, had x-ray by chiropractor couple years ago, reported cervical degenerative changes  She was seen by Dr. Rush 2023 for chronic migraine headaches, overall is under better control, 2-3 times each month, often triggered by weather change, menstruation, stress, dehydration, chocolate,  she is taking Excedrin Migraine as needed, tried Amerge, make her very fatigued afterwards  MRI brain w/wo Sept 2023,  6mm pinel cyst Laboratory evaluation from [facility] February 2025, normal CMP, creatinine 0.78, LDL 187 elevated,  PHYSICAL EXAM:   Vitals:   10/12/24 0828  BP: 120/82  Pulse: 67  SpO2: 99%  Weight: 159 lb (72.1 kg)  Height: 5' 1 (1.549 m)   Body mass index is 30.04 kg/m.  PHYSICAL EXAMNIATION:  Gen: NAD, conversant, well nourised, well groomed                     Cardiovascular: Regular rate rhythm, no peripheral edema, warm, nontender. Eyes: Conjunctivae clear without exudates or hemorrhage Neck: Supple, no carotid bruits. Pulmonary: Clear to auscultation bilaterally   NEUROLOGICAL EXAM:  MENTAL STATUS: Speech/cognition: Awake, alert, oriented to history taking and casual conversation CRANIAL NERVES: CN II: Visual fields are full to confrontation. Pupils are round equal and briskly reactive to light. CN III, IV, VI: extraocular movement are normal. No ptosis. CN V: Facial sensation is intact to light touch CN VII: Face is symmetric with normal eye closure  CN VIII: Hearing is normal to causal conversation. CN IX, X: Phonation is normal. CN XI: Head turning and shoulder shrug are intact  MOTOR: There is no pronator drift of out-stretched arms. Muscle bulk and tone are normal. Muscle strength is normal.  REFLEXES: Reflexes are 2+ and symmetric at the biceps, triceps, knees, and ankles. Plantar responses are extensor  SENSORY: Intact to light touch, pinprick and vibratory sensation are intact in fingers and toes.  COORDINATION: There is no trunk or limb dysmetria noted.  GAIT/STANCE: Posture is normal. Gait is steady with normal steps, base, arm swing, and turning. Heel and toe walking are normal. Tandem gait is normal.  Romberg is absent.  REVIEW OF SYSTEMS:  Full 14 system review of systems performed and notable only for as above All other  review of systems were negative.   ALLERGIES: Allergies  Allergen Reactions   Bee Venom Hives   Other Swelling    wine   Aleve [Naproxen] Itching   Benzophenone Other (See Comments)    wine spirit   Cholecalciferol Other (See Comments)   Germanium Other (See Comments)   Latex    Sulfa Antibiotics Other (See Comments)    Her father died from Garnette Louder syndrone from Sulfa    HOME MEDICATIONS: Current Outpatient Medications  Medication Sig Dispense Refill   aspirin-acetaminophen -caffeine (EXCEDRIN MIGRAINE) 250-250-65 MG tablet Take by mouth every 6 (six) hours as needed for headache.     calcium carbonate (TUMS - DOSED IN MG ELEMENTAL CALCIUM) 500 MG chewable tablet Chew 2 tablets by mouth daily as needed for indigestion or heartburn.     estradiol (ESTRACE) 0.01 % CREA vaginal cream PLEASE SEE ATTACHED FOR DETAILED DIRECTIONS     estradiol (VIVELLE-DOT) 0.05 MG/24HR patch Place 1 patch onto the skin 2 (two) times a week.     famotidine (PEPCID) 10 MG tablet Take 10 mg by mouth 2 (two) times daily.     ibuprofen  (ADVIL ,MOTRIN ) 400 MG tablet Take 1 tablet (400 mg total) by mouth every 6 (six) hours as needed. 30 tablet 0   naratriptan  (AMERGE) 2.5 MG tablet Take 1 tablet (2.5 mg total) by mouth as needed for migraine. Take one (1) tablet at onset of headache; if returns or does not resolve, may repeat after 4 hours; do not exceed five (5) mg in 24 hours. 10 tablet 6   progesterone (PROMETRIUM) 100 MG capsule TAKE 1 CAPSULE EVERY DAY BY ORAL ROUTE AT BEDTIME FOR 90 DAYS, FOR UTERINE PROTECTION AND SLEEP.     propranolol ER (INDERAL LA) 60 MG 24 hr capsule Take 60 mg by mouth daily.     WEGOVY 0.25 MG/0.5ML SOAJ SQ injection  (Patient not taking: Reported on 10/12/2024)     No current facility-administered medications for this visit.    PAST MEDICAL HISTORY: Past Medical History:  Diagnosis Date   Abnormal Pap smear 11/30/1994   Anxiety    GERD (gastroesophageal reflux  disease)    otc prn with pregnancy   Headache(784.0)    otc meds prn   SVD (spontaneous vaginal delivery)    x 1   Termination of pregnancy    x 3    PAST SURGICAL HISTORY: Past Surgical History:  Procedure Laterality Date   CERVICAL BIOPSY  W/ LOOP ELECTRODE EXCISION  05/1995   CESAREAN SECTION N/A 06/25/2013   Procedure: Primary cesarean section with delivery of baby girl at 1632. Apgars1/7/8;  Surgeon: Donna Just, DO;  Location: [facility];  Service: Obstetrics;  Laterality: N/A;   CESAREAN SECTION WITH BILATERAL TUBAL LIGATION Bilateral 06/20/2014   Procedure: REPEAT CESAREAN SECTION WITH BILATERAL TUBAL LIGATION;  Surgeon: Rosaline DELENA Luna, MD;  Location: [facility];  Service: Obstetrics;  Laterality: Bilateral;    FAMILY HISTORY: Family History  Problem Relation Age of Onset   Breast cancer Paternal Aunt     SOCIAL HISTORY: Social History   Socioeconomic History   Marital status: Single  Spouse name: Not on file   Number of children: 3   Years of education: Not on file   Highest education level: Not on file  Occupational History    Comment: works from home  Tobacco Use   Smoking status: Former    Types: Cigars    Quit date: 10/06/2012    Years since quitting: 12.0   Smokeless tobacco: Never  Substance and Sexual Activity   Alcohol use: No   Drug use: No   Sexual activity: Yes    Birth control/protection: None    Comment: pregnant  Other Topics Concern   Not on file  Social History Narrative   Caffeine- soda 1-2 daily   Social Drivers of Corporate Investment Banker Strain: Not on file  Food Insecurity: Not on file  Transportation Needs: Not on file  Physical Activity: Not on file  Stress: Not on file  Social Connections: Not on file  Intimate Partner Violence: Not on file      Modena Callander, M.D. Ph.D.  [facility] [address] [place], [place] [address] Ph: [phone] Fax: [phone]  CC:  Ransom Other,  MD [address] [place],  [place] [address]  [place], [place], [place]

## 2024-10-24 ENCOUNTER — Ambulatory Visit (INDEPENDENT_AMBULATORY_CARE_PROVIDER_SITE_OTHER)

## 2024-10-24 DIAGNOSIS — R531 Weakness: Secondary | ICD-10-CM | POA: Diagnosis not present

## 2024-10-24 DIAGNOSIS — M542 Cervicalgia: Secondary | ICD-10-CM | POA: Diagnosis not present

## 2024-10-24 DIAGNOSIS — G43709 Chronic migraine without aura, not intractable, without status migrainosus: Secondary | ICD-10-CM

## 2024-10-30 ENCOUNTER — Telehealth: Payer: Self-pay | Admitting: Neurology

## 2024-10-30 NOTE — Telephone Encounter (Signed)
 Called and scheduled for 11/01/24 at 4pm due to a free appt nd pt voiced gratitude and understanding

## 2024-10-30 NOTE — Telephone Encounter (Signed)
 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

## 2024-11-01 ENCOUNTER — Ambulatory Visit: Admitting: Neurology

## 2024-11-01 ENCOUNTER — Encounter: Payer: Self-pay | Admitting: Neurology

## 2024-11-01 VITALS — BP 135/82 | HR 75

## 2024-11-01 DIAGNOSIS — M4802 Spinal stenosis, cervical region: Secondary | ICD-10-CM | POA: Diagnosis not present

## 2024-11-01 DIAGNOSIS — G43709 Chronic migraine without aura, not intractable, without status migrainosus: Secondary | ICD-10-CM | POA: Diagnosis not present

## 2024-11-01 DIAGNOSIS — M542 Cervicalgia: Secondary | ICD-10-CM

## 2024-11-01 NOTE — Patient Instructions (Signed)
 [facility] Address: [address], [place], [place] [address] Phone: [phone]

## 2024-11-01 NOTE — Progress Notes (Signed)
 Chief Complaint  Patient presents with   Follow-up    Pt in room 15. Alone.Here migraine follow up. Pt had MRI last week.     ASSESSMENT AND PLAN  Cynthia Garcia is a 48 y.o. female   Chronic migraine  Inderal ER 60 mg daily as preventive medication, as needed NSAIDs   Intermittent left arm weakness, that is triggered by neck movement, occasionally with leg involvement leading to fall Chronic neck pain, cervical stenosis   MRI of cervical spine in November 2026 showed multilevel degenerative changes, moderate canal stenosis C5-6, mild C6-7, variable degree of foraminal narrowing, hyperreflexia, above findings suggestive of symptomatic cervical stenosis, will refer to neurosurgeon   DIAGNOSTIC DATA (LABS, IMAGING, TESTING) - I reviewed patient records, labs, notes, testing and imaging myself where available.   MEDICAL HISTORY:  Cynthia Garcia, is a 48 year old female, seen in request by her primary care PA from [facility], [place], for evaluation of intermittent arm weakness  History is obtained from the patient and review of electronic medical records. I personally reviewed pertinent available imaging films in PACS.   PMHx of  Chronic migraine Hormone supplement since May 2025, did help hot flashes and mood swing,  For over 15 years, she has intermittent arm weakness, also happened when she first woke up from overnight sleep, sometimes triggered by neck movement, describes sudden onset abnormal sensation in her cervical region, followed by left arm goes limp, she has to use right arm for support, symptom last about few minutes, would gradually improve,  She also described episode of left leg give up under her, fell in the shower, has to crawl out,  She has chronic low back pain, neck pain, had x-ray by chiropractor couple years ago, reported cervical degenerative changes  She was seen by Dr. Rush 2023 for chronic migraine headaches, overall is under better control, 2-3  times each month, often triggered by weather change, menstruation, stress, dehydration, chocolate, she is taking Excedrin Migraine as needed, tried Amerge, make her very fatigued afterwards  MRI brain w/wo Sept 2023,  6mm pinel cyst Laboratory evaluation from [facility] February 2025, normal CMP, creatinine 0.78, LDL 187 elevated,  UPDATE Nov 01 2024: Here to review MRI of cervical spine that was done on October 25, 2024: Multilevel degenerative changes, most noticeable C5-6, with moderate canal stenosis, milder stenosis at C6-7, variable degree of foraminal narrowing  She complains of chronic neck pain and tension, if she moves her neck wrong, sometimes she felt radiating paresthesia along her left arm, sometimes involving her left leg, make her left give out underneath her  She denied persistent sensorimotor deficit, no bowel and bladder incontinence, do have hyperreflexia on examination, above symptoms and signs do consistent with her cervical canal stenosis we will refer to neurosurgeon  PHYSICAL EXAM:   Vitals:   11/01/24 1554  BP: 135/82  Pulse: 75   PHYSICAL EXAMNIATION:  Gen: NAD, conversant, well nourised, well groomed                     Cardiovascular: Regular rate rhythm, no peripheral edema, warm, nontender. Eyes: Conjunctivae clear without exudates or hemorrhage Neck: Supple, no carotid bruits. Pulmonary: Clear to auscultation bilaterally   NEUROLOGICAL EXAM:  MENTAL STATUS: Speech/cognition: Awake, alert, oriented to history taking and casual conversation CRANIAL NERVES: CN II: Visual fields are full to confrontation. Pupils are round equal and briskly reactive to light. CN III, IV, VI: extraocular movement are normal. No ptosis. CN  V: Facial sensation is intact to light touch CN VII: Face is symmetric with normal eye closure  CN VIII: Hearing is normal to causal conversation. CN IX, X: Phonation is normal. CN XI: Head turning and shoulder shrug are  intact  MOTOR: There is no pronator drift of out-stretched arms. Muscle bulk and tone are normal. Muscle strength is normal.  REFLEXES: Reflexes are 2+ and symmetric at the biceps, triceps, knees, and ankles. Plantar responses are mute  SENSORY: Intact to light touch, pinprick and vibratory sensation are intact in fingers and toes.  COORDINATION: There is no trunk or limb dysmetria noted.  GAIT/STANCE: Posture is normal. Gait is steady with normal steps, base, arm swing, and turning. Heel and toe walking are normal. Tandem gait is normal.  Romberg is absent.  REVIEW OF SYSTEMS:  Full 14 system review of systems performed and notable only for as above All other review of systems were negative.   ALLERGIES: Allergies  Allergen Reactions   Bee Venom Hives   Other Swelling    wine   Aleve [Naproxen] Itching   Benzophenone Other (See Comments)    wine spirit   Cholecalciferol Other (See Comments)   Germanium Other (See Comments)   Latex    Sulfa Antibiotics Other (See Comments)    Her father died from Garnette Louder syndrone from Sulfa    HOME MEDICATIONS: Current Outpatient Medications  Medication Sig Dispense Refill   aspirin-acetaminophen -caffeine (EXCEDRIN MIGRAINE) 250-250-65 MG tablet Take by mouth every 6 (six) hours as needed for headache.     calcium carbonate (TUMS - DOSED IN MG ELEMENTAL CALCIUM) 500 MG chewable tablet Chew 2 tablets by mouth daily as needed for indigestion or heartburn.     estradiol (VIVELLE-DOT) 0.05 MG/24HR patch Place 1 patch onto the skin 2 (two) times a week.     famotidine (PEPCID) 10 MG tablet Take 10 mg by mouth 2 (two) times daily.     ibuprofen  (ADVIL ,MOTRIN ) 400 MG tablet Take 1 tablet (400 mg total) by mouth every 6 (six) hours as needed. 30 tablet 0   progesterone (PROMETRIUM) 100 MG capsule TAKE 1 CAPSULE EVERY DAY BY ORAL ROUTE AT BEDTIME FOR 90 DAYS, FOR UTERINE PROTECTION AND SLEEP.     propranolol ER (INDERAL LA) 60 MG 24 hr  capsule Take 60 mg by mouth daily.     estradiol (ESTRACE) 0.01 % CREA vaginal cream PLEASE SEE ATTACHED FOR DETAILED DIRECTIONS (Patient not taking: Reported on 11/01/2024)     naratriptan  (AMERGE) 2.5 MG tablet Take 1 tablet (2.5 mg total) by mouth as needed for migraine. Take one (1) tablet at onset of headache; if returns or does not resolve, may repeat after 4 hours; do not exceed five (5) mg in 24 hours. (Patient not taking: Reported on 11/01/2024) 10 tablet 6   WEGOVY 0.25 MG/0.5ML SOAJ SQ injection  (Patient not taking: Reported on 11/01/2024)     No current facility-administered medications for this visit.    PAST MEDICAL HISTORY: Past Medical History:  Diagnosis Date   Abnormal Pap smear 11/30/1994   Anxiety    GERD (gastroesophageal reflux disease)    otc prn with pregnancy   Headache(784.0)    otc meds prn   SVD (spontaneous vaginal delivery)    x 1   Termination of pregnancy    x 3    PAST SURGICAL HISTORY: Past Surgical History:  Procedure Laterality Date   CERVICAL BIOPSY  W/ LOOP ELECTRODE EXCISION  05/1995  CESAREAN SECTION N/A 06/25/2013   Procedure: Primary cesarean section with delivery of baby girl at 1632. Apgars1/7/8;  Surgeon: Donna Just, DO;  Location: [facility];  Service: Obstetrics;  Laterality: N/A;   CESAREAN SECTION WITH BILATERAL TUBAL LIGATION Bilateral 06/20/2014   Procedure: REPEAT CESAREAN SECTION WITH BILATERAL TUBAL LIGATION;  Surgeon: Rosaline DELENA Luna, MD;  Location: [facility];  Service: Obstetrics;  Laterality: Bilateral;    FAMILY HISTORY: Family History  Problem Relation Age of Onset   Breast cancer Paternal Aunt     SOCIAL HISTORY: Social History   Socioeconomic History   Marital status: Single    Spouse name: Not on file   Number of children: 3   Years of education: Not on file   Highest education level: Not on file  Occupational History    Comment: works from home  Tobacco Use   Smoking status: Former    Types: Cigars     Quit date: 10/06/2012    Years since quitting: 12.0   Smokeless tobacco: Never  Vaping Use   Vaping status: Never Used  Substance and Sexual Activity   Alcohol use: No   Drug use: No   Sexual activity: Yes    Birth control/protection: None    Comment: pregnant  Other Topics Concern   Not on file  Social History Narrative   Caffeine- soda 1-2 daily   Social Drivers of Corporate Investment Banker Strain: Not on file  Food Insecurity: Not on file  Transportation Needs: Not on file  Physical Activity: Not on file  Stress: Not on file  Social Connections: Not on file  Intimate Partner Violence: Not on file      Modena Callander, M.D. Ph.D.  [facility] [address] [place], [place] [address] Ph: [phone] Fax: [phone]  CC:  Stacia Millman, PA [address] [place],  [place] [address]  [place], [place], [place]

## 2024-11-28 NOTE — Progress Notes (Addendum)
 "  Referring Physician:  Onita Duos, MD [address] [place],  [place] [address]  Primary Physician:  Stacia Millman, PA  History of Present Illness: 12/05/2024 Ms. Cyndee Lequire has a history of chronic migraines and cervical stenosis, anxiety, GERD.   She has chronic constant neck pain with radiation to both shoulders x years. She has intermittent pain into both arms to her wrist. Neck pain > arm pain, right arm pain = left arm pain. Pain is more of a pressure. She has electric shock pains in left arm that are intermittent and worse when she she puts ear to left shoulder. She feels like left arm is limp and it spasms x years. This also happens in her right leg when she stretches her neck. She notes weakness in right leg- her knee gives way and she falls. She has numbness and tingling in right leg. Pain is worse with stress, laying on right/left side. Minimal LBP with no leg pain.   She is not taking anything for her neck. Chiropractor helped with migraines.    Tobacco use: Does not smoke.   Bowel/Bladder Dysfunction: none  Conservative measures: chiropractor x 1 year-Dr Huff-Piatt Physical therapy:  has not participated in Multimodal medical therapy including regular antiinflammatories:  Ibuprofen  Injections: no epidural steroid injections  Past Surgery: no spine surgery  Lakesia K Campo has no symptoms of cervical myelopathy.  The symptoms are causing a significant impact on the patient's life.   Review of Systems:  A 10 point review of systems is negative, except for the pertinent positives and negatives detailed in the HPI.  Past Medical History: Past Medical History:  Diagnosis Date   Abnormal Pap smear 11/30/1994   Anxiety    GERD (gastroesophageal reflux disease)    otc prn with pregnancy   Headache(784.0)    otc meds prn   SVD (spontaneous vaginal delivery)    x 1   Termination of pregnancy    x 3    Past Surgical History: Past Surgical History:   Procedure Laterality Date   CERVICAL BIOPSY  W/ LOOP ELECTRODE EXCISION  05/1995   CESAREAN SECTION N/A 06/25/2013   Procedure: Primary cesarean section with delivery of baby girl at 1632. Apgars1/7/8;  Surgeon: Donna Just, DO;  Location: [facility];  Service: Obstetrics;  Laterality: N/A;   CESAREAN SECTION WITH BILATERAL TUBAL LIGATION Bilateral 06/20/2014   Procedure: REPEAT CESAREAN SECTION WITH BILATERAL TUBAL LIGATION;  Surgeon: Rosaline DELENA Camdan Burdi, MD;  Location: [facility];  Service: Obstetrics;  Laterality: Bilateral;    Allergies: Allergies as of 12/05/2024 - Review Complete 12/05/2024  Allergen Reaction Noted   Bee venom Hives 10/13/2016   Other Swelling 10/13/2016   Aleve [naproxen] Itching 09/01/2022   Benzophenone Other (See Comments) 10/12/2024   Cholecalciferol Other (See Comments) 10/12/2024   Germanium Other (See Comments) 10/12/2024   Latex  01/09/2020   Sulfa antibiotics Other (See Comments) 06/25/2013    Medications: Outpatient Encounter Medications as of 12/05/2024  Medication Sig   aspirin-acetaminophen -caffeine (EXCEDRIN MIGRAINE) 250-250-65 MG tablet Take by mouth every 6 (six) hours as needed for headache.   calcium carbonate (TUMS - DOSED IN MG ELEMENTAL CALCIUM) 500 MG chewable tablet Chew 2 tablets by mouth daily as needed for indigestion or heartburn.   estradiol (VIVELLE-DOT) 0.05 MG/24HR patch Place 1 patch onto the skin 2 (two) times a week.   famotidine (PEPCID) 10 MG tablet Take 10 mg by mouth 2 (two) times daily.   ibuprofen  (ADVIL ,MOTRIN ) 400  MG tablet Take 1 tablet (400 mg total) by mouth every 6 (six) hours as needed.   levonorgestrel  (MIRENA ) 20 MCG/DAY IUD 1 each by Intrauterine route once.   naratriptan  (AMERGE) 2.5 MG tablet Take 1 tablet (2.5 mg total) by mouth as needed for migraine. Take one (1) tablet at onset of headache; if returns or does not resolve, may repeat after 4 hours; do not exceed five (5) mg in 24 hours.   progesterone (PROMETRIUM)  100 MG capsule TAKE 1 CAPSULE EVERY DAY BY ORAL ROUTE AT BEDTIME FOR 90 DAYS, FOR UTERINE PROTECTION AND SLEEP.   propranolol ER (INDERAL LA) 60 MG 24 hr capsule Take 60 mg by mouth daily.   WEGOVY 0.25 MG/0.5ML SOAJ SQ injection  (Patient not taking: Reported on 12/05/2024)   [DISCONTINUED] estradiol (ESTRACE) 0.01 % CREA vaginal cream PLEASE SEE ATTACHED FOR DETAILED DIRECTIONS (Patient not taking: Reported on 11/01/2024)   No facility-administered encounter medications on file as of 12/05/2024.    Social History: Social History[1]  Family Medical History: Family History  Problem Relation Age of Onset   Breast cancer Paternal Aunt     Physical Examination: Vitals:   12/05/24 0958  BP: 124/88    General: Patient is well developed, well nourished, calm, collected, and in no apparent distress. Attention to examination is appropriate.  Respiratory: Patient is breathing without any difficulty.   NEUROLOGICAL:     Awake, alert, oriented to person, place, and time.  Speech is clear and fluent. Fund of knowledge is appropriate.   Cranial Nerves: Pupils equal round and reactive to light.  Facial tone is symmetric.    no posterior cervical tenderness. Mild tenderness in bilateral trapezial region.   Good ROM of both shoulders with no pain.   No abnormal lesions on exposed skin.   Strength: Side Biceps Triceps Deltoid Interossei Grip Wrist Ext. Wrist Flex.  R 5 5 5 5 5 5 5   L 5 5 5 5 5 5 5    Side Iliopsoas Quads Hamstring PF DF EHL  R 5 5 5 5 5 5   L 5 5 5 5 5 5    Reflexes are 2+ and symmetric at the biceps, brachioradialis, patella and achilles.   Hoffman's is absent.  Clonus is not present.   Bilateral upper and lower extremity sensation is intact to light touch, but decreased in right LE compared to left LE.   Good ROM of both hips with no pain.   Gait is normal.     Medical Decision Making  Imaging: Cervical MRI dated 10/24/24:  FINDINGS:  The cervical vertebrae  demonstrate abnormal alignment with loss of forward lordotic curvature posterior subluxation of C2-C3 4 over C5-6.  There are prominent endplate degenerative changes at C5-6. C2-3 shows minor disc and facet degenerative changes without significant compression. C3-4 shows focal central disc protrusion and minor facet degenerative changes but no compression. C4-5 shows mild disc and endplate degenerative changes along with facet hypertrophy and central disc protrusion resulting in effacement of the thecal sac ventrally but no definite compression. C5-6 shows prominent disc and endplate degenerative changes along with ligamentum flavum and facet hypertrophy resulting in mild canal and moderate right and mild left-sided foraminal narrowing.  There is no definite nerve root impingement noted C6/7 also shows mild endplate and disc degenerative changes along with ligamentum flavum hypertrophy and facet hypertrophy resulting in mild canal and bilateral foraminal narrowing. C7-T1 with only mild adjacent facet degenerative changes without compression. The spinal cord parenchyma shows  no abnormal signal intensities.  There are no paraspinal soft tissue appear unremarkable.  Visualized portion of the lower brainstem and craniovertebral junction.       IMPRESSION: MRI scan of cervical spine without contrast showing severe spondylitic changes at C5-6 and C6/7 resulting in mild canal and moderate right greater than left foraminal narrowing.        INTERPRETING PHYSICIAN:  EATHER POPP, MD Certified in  Neuroimaging by American Society of Neuroimaging and [employer]ities  I have personally reviewed the images and agree with the above interpretation.  Assessment and Plan: Ms. Batra has history of chronic constant neck pain with radiation to both shoulders x years.   She has intermittent pain into both arms to her wrist. Neck pain > arm pain, right arm pain = left arm pain.    She has electric shock pains in left arm that are intermittent and worse when she she puts ear to left shoulder. She feels like left arm is limp and it spasms x years. This also happens in her right leg when she stretches her neck.   She has known cervical spondylosis C3-C7 with mild central stenosis C5-C7. She has moderate right/mild left foraminal stenosis C5-C6 and mild bilateral foraminal stenosis C6-C7.    She has minimal LBP with no leg pain. She has right knee pain and has fallen with knee giving way.   No lumbar imaging.   Treatment options discussed with patient and following plan made:   - Cervical xrays with flex/ext on her way out. Will message her with results. - Order for physical therapy for cervical spine to [facility] in [place].  - Discussed possible lumbar MRI for right leg giving way, but has good strength on exam. Knee gives way causing her to fall. Referral to ortho [facility]) for right knee pain.  - Discussed cervical injections and she declines for now.  - Will discuss left arm symptoms (flopping when putting head to left shoulder) with Dr. Claudene and message her with his recommendations. Will schedule follow up at that time.   I spent a total of 45 minutes in face-to-face and non-face-to-face activities related to this patient's care today including review of outside records, review of imaging, review of symptoms, physical exam, discussion of differential diagnosis, discussion of treatment options, and documentation.   Thank you for involving me in the care of this patient.   ADDENDUM 12/08/24:  Reviewed patient with Dr. Claudene and he would like to see her in clinic. I called to discus with her and got her voicemail. He had a cancellation on Monday so I put her in that slot and sent her a MyChart message.   Glade Boys PA-C Dept. of Neurosurgery      [1]  Social History Tobacco Use   Smoking status: Former    Types: Cigars    Quit date: 10/06/2012    Years since  quitting: 12.1   Smokeless tobacco: Never  Vaping Use   Vaping status: Never Used  Substance Use Topics   Alcohol use: No   Drug use: No   "

## 2024-12-05 ENCOUNTER — Encounter: Payer: Self-pay | Admitting: Orthopedic Surgery

## 2024-12-05 ENCOUNTER — Ambulatory Visit

## 2024-12-05 ENCOUNTER — Ambulatory Visit: Admitting: Orthopedic Surgery

## 2024-12-05 VITALS — BP 124/88 | Ht 61.0 in | Wt 156.0 lb

## 2024-12-05 DIAGNOSIS — M542 Cervicalgia: Secondary | ICD-10-CM

## 2024-12-05 DIAGNOSIS — M5412 Radiculopathy, cervical region: Secondary | ICD-10-CM

## 2024-12-05 DIAGNOSIS — M4802 Spinal stenosis, cervical region: Secondary | ICD-10-CM

## 2024-12-05 DIAGNOSIS — M47812 Spondylosis without myelopathy or radiculopathy, cervical region: Secondary | ICD-10-CM

## 2024-12-05 DIAGNOSIS — M4722 Other spondylosis with radiculopathy, cervical region: Secondary | ICD-10-CM | POA: Diagnosis not present

## 2024-12-05 DIAGNOSIS — M25561 Pain in right knee: Secondary | ICD-10-CM | POA: Diagnosis not present

## 2024-12-05 DIAGNOSIS — M545 Low back pain, unspecified: Secondary | ICD-10-CM

## 2024-12-05 NOTE — Patient Instructions (Signed)
 It was so nice to see you today. Thank you so much for coming in.    You have some wear and tear in your neck with mild spinal stenosis (pressure on spinal cord) at C5-C6 and C6-C7.   I want to get xrays of your neck today. Will message you with results.   I sent physical therapy orders to [facility] in [place]. You can call them at number below if you don't hear from them to schedule your visit.   I want you to see [facility] in [facility] for your right knee. Dr. Genelle is great and will take good care of you. They should call you to schedule an appointment or you can call them at [phone].   I will talk to Dr. Claudene about your left arm symptoms and message you regarding this. Will schedule follow up at that time.   Please do not hesitate to call if you have any questions or concerns. You can also message me in MyChart.   Glade Boys PA-C [phone]     The physicians and staff at [facility] are committed to providing excellent care. You may receive a survey asking for feedback about your experience at our office. We value you your feedback and appreciate you taking the time to to fill it out. The [facility] leadership team is also available to discuss your experience in person, feel free to contact us  [phone].

## 2024-12-08 ENCOUNTER — Ambulatory Visit (INDEPENDENT_AMBULATORY_CARE_PROVIDER_SITE_OTHER)

## 2024-12-08 ENCOUNTER — Ambulatory Visit (INDEPENDENT_AMBULATORY_CARE_PROVIDER_SITE_OTHER): Admitting: Orthopaedic Surgery

## 2024-12-08 ENCOUNTER — Other Ambulatory Visit: Payer: Self-pay | Admitting: Physician Assistant

## 2024-12-08 DIAGNOSIS — M25562 Pain in left knee: Secondary | ICD-10-CM

## 2024-12-08 DIAGNOSIS — G8929 Other chronic pain: Secondary | ICD-10-CM | POA: Diagnosis not present

## 2024-12-08 DIAGNOSIS — Z1231 Encounter for screening mammogram for malignant neoplasm of breast: Secondary | ICD-10-CM

## 2024-12-08 DIAGNOSIS — M25561 Pain in right knee: Secondary | ICD-10-CM | POA: Diagnosis not present

## 2024-12-08 NOTE — Telephone Encounter (Signed)
 Pt called back in & accepted date.

## 2024-12-08 NOTE — Addendum Note (Signed)
 Addended by: WOLFGANG CONLEY HERO on: 12/08/2024 03:25 PM   Modules accepted: Orders

## 2024-12-08 NOTE — Progress Notes (Signed)
 "   Chief Complaint: Right knee pain     History of Present Illness:    Cynthia Garcia is a 49 y.o. female with ongoing traumatic right knee pain after a fall in the summer 2025 where she landed directly on the knee.  Since this time she has been having recurrent swelling and pain particular about the medial aspect of the knee as well as the front of the knee.  She is experiencing mechanical symptoms including catching grinding and giving way.  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 Apgars1/7/8;  Surgeon: Donna Just, DO;  Location: [facility];  Service: Obstetrics;  Laterality: N/A;   CESAREAN SECTION WITH BILATERAL TUBAL LIGATION Bilateral 06/20/2014   Procedure: REPEAT CESAREAN SECTION WITH BILATERAL TUBAL LIGATION;  Surgeon: Rosaline DELENA Luna, MD;  Location: [facility];  Service: Obstetrics;  Laterality: Bilateral;   Social History   Socioeconomic History   Marital status: Single    Spouse name: Not on file   Number of children: 3   Years of education: Not on file   Highest education level: Not on file  Occupational History    Comment: works from home  Tobacco Use   Smoking status: Former    Types: Cigars    Quit date: 10/06/2012    Years since quitting: 12.1   Smokeless tobacco: Never  Vaping Use   Vaping status: Never Used  Substance and Sexual Activity   Alcohol use: No   Drug use: No   Sexual  activity: Yes    Birth control/protection: None    Comment: pregnant  Other Topics Concern   Not on file  Social History Narrative   Caffeine- soda 1-2 daily   Social Drivers of Health   Tobacco Use: Medium Risk (12/05/2024)   Patient History    Smoking Tobacco Use: Former    Smokeless Tobacco Use: Never    Passive Exposure: Not on Actuary Strain: Not on file  Food Insecurity: Not on file  Transportation Needs: Not on file  Physical Activity: Not on file  Stress: Not on file  Social Connections: Not on file  Depression (PHQ2-9): Not on file  Alcohol Screen: Not on file  Housing: Not on file  Utilities: Not on file  Health Literacy: Not on file   Family History  Problem Relation Age of Onset   Breast cancer Paternal Aunt    Allergies[1] Current Outpatient Medications  Medication Sig Dispense Refill   aspirin-acetaminophen -caffeine (EXCEDRIN MIGRAINE) 250-250-65 MG tablet Take by mouth every 6 (six) hours as needed for headache.     calcium carbonate (TUMS - DOSED IN MG ELEMENTAL CALCIUM) 500 MG chewable tablet Chew 2 tablets by mouth daily as needed for indigestion or heartburn.     estradiol (VIVELLE-DOT) 0.05 MG/24HR patch Place 1 patch onto the skin 2 (  two) times a week.     famotidine (PEPCID) 10 MG tablet Take 10 mg by mouth 2 (two) times daily.     ibuprofen  (ADVIL ,MOTRIN ) 400 MG tablet Take 1 tablet (400 mg total) by mouth every 6 (six) hours as needed. 30 tablet 0   levonorgestrel  (MIRENA ) 20 MCG/DAY IUD 1 each by Intrauterine route once.     naratriptan  (AMERGE) 2.5 MG tablet Take 1 tablet (2.5 mg total) by mouth as needed for migraine. Take one (1) tablet at onset of headache; if returns or does not resolve, may repeat after 4 hours; do not exceed five (5) mg in 24 hours. 10 tablet 6   progesterone (PROMETRIUM) 100 MG capsule TAKE 1 CAPSULE EVERY DAY BY ORAL ROUTE AT BEDTIME FOR 90 DAYS, FOR UTERINE PROTECTION AND SLEEP.     propranolol ER (INDERAL  LA) 60 MG 24 hr capsule Take 60 mg by mouth daily.     WEGOVY 0.25 MG/0.5ML SOAJ SQ injection  (Patient not taking: Reported on 12/05/2024)     No current facility-administered medications for this visit.   No results found.  Review of Systems:   A ROS was performed including pertinent positives and negatives as documented in the HPI.  Physical Exam :   Constitutional: NAD and appears stated age Neurological: Alert and oriented Psych: Appropriate affect and cooperative There were no vitals taken for this visit.   Comprehensive Musculoskeletal Exam:    Right knee with tenderness about the medial joint line.  Positive McMurray.  Trace effusion negative Lachman, range of motion is from -3 to 130 degrees   Imaging:   Xray (4 views right knee): Normal     I personally reviewed and interpreted the radiographs.   Assessment and Plan:   49 y.o. female with evidence of traumatic medial meniscal tear with mechanical symptoms following a previous injury in the summer 2025.  At this time she has tried activity restriction as well as anti-inflammatories without any relief.  Given this we will plan for an MRI of the right knee and follow-up discussed results  -Plan for MRI right knee and follow-up to discuss results    I personally saw and evaluated the patient, and participated in the management and treatment plan.  Elspeth Parker, MD Attending Physician, Orthopedic Surgery  This document was dictated using Dragon voice recognition software. A reasonable attempt at proof reading has been made to minimize errors.    [1]  Allergies Allergen Reactions   Bee Venom Hives   Other Swelling    wine   Aleve [Naproxen] Itching   Benzophenone Other (See Comments)    wine spirit   Cholecalciferol Other (See Comments)   Germanium Other (See Comments)   Latex    Sulfa Antibiotics Other (See Comments)    Her father died from Garnette Louder syndrone from Sulfa   "

## 2024-12-11 ENCOUNTER — Encounter: Payer: Self-pay | Admitting: Neurosurgery

## 2024-12-11 ENCOUNTER — Ambulatory Visit: Admitting: Neurosurgery

## 2024-12-11 VITALS — BP 116/84 | Ht 61.0 in | Wt 156.0 lb

## 2024-12-11 DIAGNOSIS — M5412 Radiculopathy, cervical region: Secondary | ICD-10-CM

## 2024-12-11 DIAGNOSIS — M47812 Spondylosis without myelopathy or radiculopathy, cervical region: Secondary | ICD-10-CM

## 2024-12-11 DIAGNOSIS — M4712 Other spondylosis with myelopathy, cervical region: Secondary | ICD-10-CM | POA: Diagnosis not present

## 2024-12-11 DIAGNOSIS — M4802 Spinal stenosis, cervical region: Secondary | ICD-10-CM

## 2024-12-11 DIAGNOSIS — M4722 Other spondylosis with radiculopathy, cervical region: Secondary | ICD-10-CM

## 2024-12-11 NOTE — Patient Instructions (Signed)

## 2024-12-11 NOTE — Progress Notes (Signed)
 "  Referring Physician:  Stacia, Alexus, PA [address] [place],  [place] [address]  Primary Physician:  Cynthia Millman, PA  Discussed the use of AI scribe software for clinical note transcription with the patient, who gave verbal consent to proceed.  History of Present Illness Cynthia Garcia is a 49 year old female with cervical spondylotic myelopathy and radiculopathy who presents for evaluation of neck pain and intermittent limb dysfunction.  Neck pain began in 2009-2010 and has slowly become more frequent. It is a persistent pressure-type discomfort in the neck that worsens with neck movement or positions such as lifting her shoulders or lying in bed, with radiation down both arms. Elevating her arms or changing position gives partial relief. Pain is not severe but is chronically uncomfortable.  She has episodic transient loss of control in her left, most often with neck extension, such as stretching in the shower. During episodes she cannot feel or control the limb and must physically restrain it. Events have occurred in both arms but mostly in the left arm and once in the right leg, with increasing frequency, and now occur with driving, sitting at her desk, and resting on the couch. She denies true flaccidity or persistent sensory loss, and symptoms are not provoked by Valsalva or heavy lifting, but mainly by neck extension.  She notes increased sensitivity in the right leg and has had falls when the leg is involved. She denies baseline gait instability, clumsiness, difficulty walking long distances, fine motor problems, hand weakness, dropping objects, or bowel or bladder dysfunction.  She has migraines starting in the occipital region and affecting the left side, sometimes with aura. Chiropractic manipulation previously gave temporary relief of head and neck pressure but she has stopped this. She has not done physical therapy for her neck.   Tobacco use: Does not  smoke.   Bowel/Bladder Dysfunction: none  Conservative measures: chiropractor x 1 year-Cynthia Garcia Physical therapy:  has not participated in Multimodal medical therapy including regular antiinflammatories:  Ibuprofen  Injections: no epidural steroid injections  Past Surgery: no spine surgery  Cynthia Garcia has no symptoms of cervical myelopathy.  The symptoms are causing a significant impact on the patient's life.   Review of Systems:  A 10 point review of systems is negative, except for the pertinent positives and negatives detailed in the HPI.  Past Medical History: Past Medical History:  Diagnosis Date   Abnormal Pap smear 11/30/1994   Anxiety    GERD (gastroesophageal reflux disease)    otc prn with pregnancy   Headache(784.0)    otc meds prn   SVD (spontaneous vaginal delivery)    x 1   Termination of pregnancy    x 3    Past Surgical History: Past Surgical History:  Procedure Laterality Date   CERVICAL BIOPSY  W/ LOOP ELECTRODE EXCISION  05/1995   CESAREAN SECTION N/A 06/25/2013   Procedure: Primary cesarean section with delivery of baby girl at 1632. Apgars1/7/8;  Surgeon: Cynthia Just, DO;  Location: [facility];  Service: Obstetrics;  Laterality: N/A;   CESAREAN SECTION WITH BILATERAL TUBAL LIGATION Bilateral 06/20/2014   Procedure: REPEAT CESAREAN SECTION WITH BILATERAL TUBAL LIGATION;  Surgeon: Cynthia DELENA Luna, MD;  Location: [facility];  Service: Obstetrics;  Laterality: Bilateral;    Allergies: Allergies as of 12/11/2024 - Review Complete 12/11/2024  Allergen Reaction Noted   Alcohol Anaphylaxis 12/11/2024   Bee venom Hives 10/13/2016   Other Swelling 10/13/2016   Aleve [naproxen]  Itching 09/01/2022   Benzophenone Other (See Comments) 10/12/2024   Cholecalciferol Other (See Comments) 10/12/2024   Germanium Other (See Comments) 10/12/2024   Latex  01/09/2020   Sulfa antibiotics Other (See Comments) 06/25/2013    Medications: Outpatient Encounter  Medications as of 12/11/2024  Medication Sig   aspirin-acetaminophen -caffeine (EXCEDRIN MIGRAINE) 250-250-65 MG tablet Take by mouth every 6 (six) hours as needed for headache.   calcium carbonate (TUMS - DOSED IN MG ELEMENTAL CALCIUM) 500 MG chewable tablet Chew 2 tablets by mouth daily as needed for indigestion or heartburn.   EPINEPHrine  0.3 mg/0.3 mL IJ SOAJ injection as directed Injection as directed As needed   estradiol (VIVELLE-DOT) 0.05 MG/24HR patch Place 1 patch onto the skin 2 (two) times a week.   famotidine (PEPCID) 10 MG tablet Take 10 mg by mouth 2 (two) times daily.   ibuprofen  (ADVIL ,MOTRIN ) 400 MG tablet Take 1 tablet (400 mg total) by mouth every 6 (six) hours as needed.   levonorgestrel  (MIRENA ) 20 MCG/DAY IUD 1 each by Intrauterine route once.   naratriptan  (AMERGE) 2.5 MG tablet Take 1 tablet (2.5 mg total) by mouth as needed for migraine. Take one (1) tablet at onset of headache; if returns or does not resolve, may repeat after 4 hours; do not exceed five (5) mg in 24 hours.   progesterone (PROMETRIUM) 100 MG capsule TAKE 1 CAPSULE EVERY DAY BY ORAL ROUTE AT BEDTIME FOR 90 DAYS, FOR UTERINE PROTECTION AND SLEEP.   propranolol ER (INDERAL LA) 60 MG 24 hr capsule Take 60 mg by mouth daily.   WEGOVY 0.25 MG/0.5ML SOAJ SQ injection    No facility-administered encounter medications on file as of 12/11/2024.    Social History: Social History[1]  Family Medical History: Family History  Problem Relation Age of Onset   Breast cancer Paternal Aunt     Physical Examination: Vitals:   12/11/24 1014  BP: 116/84    General: Patient is well developed, well nourished, calm, collected, and in no apparent distress. Attention to examination is appropriate.  Respiratory: Patient is breathing without any difficulty.   NEUROLOGICAL:     Awake, alert, oriented to person, place, and time.  Speech is clear and fluent. Fund of knowledge is appropriate.   Cranial Nerves: Pupils  equal round and reactive to light.  Facial tone is symmetric.    no posterior cervical tenderness. Mild tenderness in bilateral trapezial region.   Good ROM of both shoulders with no pain.   No abnormal lesions on exposed skin.   Strength: Side Biceps Triceps Deltoid Interossei Grip Wrist Ext. Wrist Flex.  R 5 5 5 5 5 5 5   L 5 5 5 5 5 5 5    Side Iliopsoas Quads Hamstring PF DF EHL  R 5 5 5 5 5 5   L 5 5 5 5 5 5    Reflexes are 2+ at the biceps, brachioradialis and 3+ on the left.  Lower extremity light reflexes are more brisk on the right.  She has 2 beats of clonus on that side and a positive Babinski sign.  Hoffman's is present but more strong in the left hand   Good ROM of both hips with no pain.  Gait is normal.     Medical Decision Making  Imaging: Cervical MRI dated 10/24/24:  FINDINGS:  The cervical vertebrae demonstrate abnormal alignment with loss of forward lordotic curvature posterior subluxation of C2-C3 4 over C5-6.  There are prominent endplate degenerative changes at C5-6. C2-3 shows minor  disc and facet degenerative changes without significant compression. C3-4 shows focal central disc protrusion and minor facet degenerative changes but no compression. C4-5 shows mild disc and endplate degenerative changes along with facet hypertrophy and central disc protrusion resulting in effacement of the thecal sac ventrally but no definite compression. C5-6 shows prominent disc and endplate degenerative changes along with ligamentum flavum and facet hypertrophy resulting in mild canal and moderate right and mild left-sided foraminal narrowing.  There is no definite nerve root impingement noted C6/7 also shows mild endplate and disc degenerative changes along with ligamentum flavum hypertrophy and facet hypertrophy resulting in mild canal and bilateral foraminal narrowing. C7-T1 with only mild adjacent facet degenerative changes without compression. The spinal cord parenchyma  shows no abnormal signal intensities.  There are no paraspinal soft tissue appear unremarkable.  Visualized portion of the lower brainstem and craniovertebral junction.       IMPRESSION: MRI scan of cervical spine without contrast showing severe spondylitic changes at C5-6 and C6/7 resulting in mild canal and moderate right greater than left foraminal narrowing.        INTERPRETING PHYSICIAN:  EATHER POPP, MD Certified in  Neuroimaging by [employer] and [employer]  I have personally reviewed the images and agree with the above interpretation.  Assessment and Plan Assessment & Plan Cervical spondylotic myelopathy with radiculopathy She presents with chronic cervicalgia, episodic without brachial and intermittent right lower extremity dysfunction, consistent with cervical mild spondylotic myelopathy and radiculopathy. Examination demonstrates hyperreflexia, clonus, and pathologic reflexes, indicating mild myelopathy without significant gait disturbance, fine motor deficits, or sphincter dysfunction.  Other than these intermittent flopping episodes in her left arm no clear signs of myelopathy.  Unclear whether or not they would be related to spinal compression. imaging reveals degenerative changes with maximal compression at C5-6, retrolisthesis, and possible instability pending radiology review. Symptoms remain mild from a spinal cord perspective. Conservative management is appropriate at this time. - Await radiology interpretation of recent cervical spine x-rays to assess for instability. - Planned to contact her with x-ray results and discuss management if instability is identified. - Advised vigilance for new or worsening symptoms, including object dropping, fine motor impairment, or gait disturbance, and to report these promptly. - Discussed avoidance of cervical spinal manipulation due to elevated risk of spinal cord injury given  her stenosis. - Recommended trial of cervical traction device for symptomatic relief; she may obtain this via physical therapy. - Advised follow-up in three months for reassessment, or sooner if symptoms progress.  Penne MICAEL Sharps, MD      [1]  Social History Tobacco Use   Smoking status: Former    Types: Cigars    Quit date: 10/06/2012    Years since quitting: 12.1   Smokeless tobacco: Never  Vaping Use   Vaping status: Never Used  Substance Use Topics   Alcohol use: No   Drug use: No   "

## 2024-12-14 ENCOUNTER — Inpatient Hospital Stay
Admission: RE | Admit: 2024-12-14 | Discharge: 2024-12-14 | Attending: Physician Assistant | Admitting: Physician Assistant

## 2024-12-14 ENCOUNTER — Ambulatory Visit: Payer: Self-pay | Admitting: Neurosurgery

## 2024-12-14 DIAGNOSIS — Z1231 Encounter for screening mammogram for malignant neoplasm of breast: Secondary | ICD-10-CM

## 2024-12-23 ENCOUNTER — Ambulatory Visit
Admission: RE | Admit: 2024-12-23 | Discharge: 2024-12-23 | Disposition: A | Source: Ambulatory Visit | Attending: Orthopaedic Surgery

## 2024-12-23 DIAGNOSIS — M25561 Pain in right knee: Secondary | ICD-10-CM

## 2025-01-04 ENCOUNTER — Ambulatory Visit: Admitting: Physical Therapy

## 2025-01-11 ENCOUNTER — Ambulatory Visit (HOSPITAL_BASED_OUTPATIENT_CLINIC_OR_DEPARTMENT_OTHER): Admitting: Orthopaedic Surgery

## 2025-01-12 ENCOUNTER — Ambulatory Visit: Admitting: Physical Therapy

## 2025-03-14 ENCOUNTER — Ambulatory Visit
